# Patient Record
Sex: Male | Born: 1935 | Race: White | Hispanic: No | Marital: Married | Smoking: Never smoker
Health system: Southern US, Community
[De-identification: ages and names within clinical notes are randomized; demographics above are authoritative.]

## PROBLEM LIST (undated history)

## (undated) DIAGNOSIS — E039 Hypothyroidism, unspecified: Secondary | ICD-10-CM

## (undated) DIAGNOSIS — Z7409 Other reduced mobility: Secondary | ICD-10-CM

## (undated) DIAGNOSIS — G8929 Other chronic pain: Secondary | ICD-10-CM

## (undated) DIAGNOSIS — I10 Essential (primary) hypertension: Secondary | ICD-10-CM

## (undated) DIAGNOSIS — W19XXXA Unspecified fall, initial encounter: Secondary | ICD-10-CM

## (undated) DIAGNOSIS — M1990 Unspecified osteoarthritis, unspecified site: Secondary | ICD-10-CM

## (undated) DIAGNOSIS — E119 Type 2 diabetes mellitus without complications: Secondary | ICD-10-CM

## (undated) DIAGNOSIS — C801 Malignant (primary) neoplasm, unspecified: Secondary | ICD-10-CM

## (undated) DIAGNOSIS — K219 Gastro-esophageal reflux disease without esophagitis: Secondary | ICD-10-CM

## (undated) DIAGNOSIS — E7800 Pure hypercholesterolemia, unspecified: Secondary | ICD-10-CM

## (undated) HISTORY — PX: OTHER SURGICAL HISTORY: SHX169

## (undated) HISTORY — PX: SKIN CANCER EXCISION: SHX779

## (undated) HISTORY — PX: CATARACT EXTRACTION: SUR2

---

## 1989-03-31 HISTORY — PX: OTHER SURGICAL HISTORY: SHX169

## 1994-03-31 HISTORY — PX: CERVICAL SPINE SURGERY: SHX589

## 2017-10-23 ENCOUNTER — Encounter: Payer: Self-pay | Admitting: Student

## 2017-10-23 NOTE — H&P (Signed)
TOTAL HIP ADMISSION H&P  Patient is admitted for right total hip arthroplasty.  Subjective:  Chief Complaint: right hip pain  HPI: Nathaniel Henry, 84 y.o. male, has a history of pain and functional disability in the right hip(s) due to arthritis and patient has failed non-surgical conservative treatments for greater than 12 weeks to include NSAID's and/or analgesics, use of assistive devices and activity modification.  Onset of symptoms was gradual starting several years ago with gradually worsening course since that time.The patient noted no past surgery on the right hip(s).  Patient currently rates pain in the right hip at 10 out of 10 with activity. Patient has worsening of pain with activity and weight bearing and pain that interfers with activities of daily living. Patient has evidence of everely arthritic hips with dislocation or near dislocation of the right hip with erosion of the superior acetabulum, lateralization of the hip center, and a high riding hip center with massive bone-on-bone formation by imaging studies. This condition presents safety issues increasing the risk of falls. There is no current active infection.  There are no active problems to display for this patient.  History reviewed. No pertinent past medical history.  History reviewed. No pertinent surgical history.  No current facility-administered medications for this encounter.    Current Outpatient Medications  Medication Sig Dispense Refill Last Dose  . atorvastatin (LIPITOR) 10 MG tablet Take 10 mg by mouth every evening.     . Cholecalciferol (VITAMIN D3) 2000 units TABS Take 2,000 Units by mouth every other day. Alternates between a multivitamin and vitamin d3 supplement     . cromolyn (OPTICROM) 4 % ophthalmic solution Place 1 drop into both eyes daily as needed (for eye irritation.).     Marland Kitchen fentaNYL (DURAGESIC - DOSED MCG/HR) 75 MCG/HR Place 75 mcg onto the skin every 3 (three) days.     Marland Kitchen gabapentin  (NEURONTIN) 300 MG capsule Take 300 mg by mouth 3 (three) times daily.     Marland Kitchen glipiZIDE (GLUCOTROL) 10 MG tablet Take 10 mg by mouth daily after breakfast.     . levothyroxine (SYNTHROID, LEVOTHROID) 75 MCG tablet Take 75 mcg by mouth daily before breakfast.     . lisinopril (PRINIVIL,ZESTRIL) 5 MG tablet Take 5 mg by mouth daily.     . metFORMIN (GLUCOPHAGE) 500 MG tablet Take 500 mg by mouth daily with breakfast.     . Multiple Vitamin (MULTIVITAMIN WITH MINERALS) TABS tablet Take 1 tablet by mouth every other day. Alternates between a multivitamin and vitamin d3 supplement     . pantoprazole (PROTONIX) 40 MG tablet Take 40 mg by mouth daily before breakfast.      No Known Allergies  Social History   Tobacco Use  . Smoking status: Not on file  Substance Use Topics  . Alcohol use: Not on file    History reviewed. No pertinent family history.   Review of Systems  Constitutional: Negative for chills and fever.  HENT: Negative for congestion, sore throat and tinnitus.   Eyes: Negative for double vision, photophobia and pain.  Respiratory: Negative for cough, shortness of breath and wheezing.   Cardiovascular: Negative for chest pain, palpitations and orthopnea.  Gastrointestinal: Negative for heartburn, nausea and vomiting.  Genitourinary: Negative for dysuria, frequency and urgency.  Musculoskeletal: Positive for joint pain.  Neurological: Negative for dizziness, weakness and headaches.  Psychiatric/Behavioral: Negative for depression.    Objective:  Physical Exam  Well nourished and well developed. General: Alert and oriented  x3, cooperative and pleasant, no acute distress. Head: normocephalic, atraumatic, neck supple. Eyes: EOMI. Respiratory: breath sounds clear in all fields, no wheezing, rales, or rhonchi. Cardiovascular: Regular rate and rhythm, no murmurs, gallops or rubs.  Abdomen: non-tender to palpation and soft, normoactive bowel sounds. Musculoskeletal: Sitting in a  wheel chair. He has his right leg adducted, it is shortened and internally rotated.  Right Knee Exam: No effusion. Range of motion is 0-125 degrees. Slight crepitus on range of motion of the knee. No medial or lateral joint line tenderness. Stable knee. Right Hip Exam: ROM: Flexion to 70 , Internal Rotation minimal, External Rotation 20, and abduction 20. Moving his hip reproduces his knee pain. There is no tenderness over the greater trochanter. There is no pain on provocative testing of the hip. Left Hip Exam: ROM: Flexion to 100 , Internal Rotation minimal, External Rotation 20, and abduction 20.There is no tenderness over the greater trochanter. There is no pain on provocative testing of the hip. Left Knee Exam: No effusion. Range of motion is 0-125 degrees. No crepitus on range of motion of the knee. No medial or lateral joint line tenderness. Stable knee. Calves soft and nontender. Motor function intact in LE. Strength 5/5 LE bilaterally. Neuro: Distal pulses 2+. Sensation to light touch intact in LE.  Vital signs in last 24 hours: Blood pressure: 126/68 mmHg Pulse: 60 bpm  Labs:   There is no height or weight on file to calculate BMI.   Imaging Review Plain radiographs demonstrate severe degenerative joint disease of the right hip(s). The bone quality appears to be adequate for age and reported activity level.    Preoperative templating of the joint replacement has been completed, documented, and submitted to the Operating Room personnel in order to optimize intra-operative equipment management.     Assessment/Plan:  End stage arthritis, right hip(s)  The patient history, physical examination, clinical judgement of the provider and imaging studies are consistent with end stage degenerative joint disease of the right hip(s) and total hip arthroplasty is deemed medically necessary. The treatment options including medical management, injection therapy, arthroscopy and  arthroplasty were discussed at length. The risks and benefits of total hip arthroplasty were presented and reviewed. The risks due to aseptic loosening, infection, stiffness, dislocation/subluxation,  thromboembolic complications and other imponderables were discussed.  The patient acknowledged the explanation, agreed to proceed with the plan and consent was signed. Patient is being admitted for inpatient treatment for surgery, pain control, PT, OT, prophylactic antibiotics, VTE prophylaxis, progressive ambulation and ADL's and discharge planning.The patient is planning to be discharged to skilled nursing facility.  Therapy Plans: SNF at [facility] Disposition: SNF Planned DVT Prophylaxis: aspirin 325 mg BID DME needed: None PCP: Ardith Dark, PA-C (medical clearance provided) TXA: IV Allergies: None  - Patient was instructed on what medications to stop prior to surgery. - Follow-up visit in 2 weeks with Dr. Wynelle Link - Begin physical therapy following surgery - Pre-operative lab work as pre-surgical testing - Prescriptions will be provided in hospital at time of discharge  Theresa Duty, PA-C Orthopedic Surgery [facility] [place]

## 2017-10-28 NOTE — Patient Instructions (Addendum)
Nathaniel Henry  10/28/2017   Your procedure is scheduled on: 11-04-17     Report to [facility] Main  Entrance    Report to admitting at 12:00PM    Call this number if you have problems the morning of surgery [phone]     Remember: Do not eat food After Midnight. YOU MAY HAVE CLEAR LIQUIDS FROM MIDNIGHT UNTIL 8:30AM. NOTHING BY MOUTH AFTER 8:30AM!     Take these medicines the morning of surgery with A SIP OF WATER: GABAPENTIN, LEVOTHYROXINE, PANTOPRAZOLE, EYE DROPS                                 You may not have any metal on your body including hair pins and              piercings  Do not wear jewelry, make-up, lotions, powders or perfumes, deodorant              Men may shave face and neck.   Do not bring valuables to the hospital. [place].  Contacts, dentures or bridgework may not be worn into surgery.  Leave suitcase in the car. After surgery it may be brought to your room.                  Please read over the following fact sheets you were given: _____________________________________________________________________     CLEAR LIQUID DIET   Foods Allowed                                                                     Foods Excluded  Coffee and tea, regular and decaf                             liquids that you cannot  Plain Jell-O in any flavor                                             see through such as: Fruit ices (not with fruit pulp)                                     milk, soups, orange juice  Iced Popsicles                                    All solid food Carbonated beverages, regular and diet                                    Cranberry, grape and apple juices Sports drinks like Gatorade Lightly seasoned clear broth or consume(fat free) Sugar, honey  syrup  Sample Menu Breakfast                                Lunch                                     Supper Cranberry  juice                    Beef broth                            Chicken broth Jell-O                                     Grape juice                           Apple juice Coffee or tea                        Jell-O                                      Popsicle                                                Coffee or tea                        Coffee or tea  _____________________________________________________________________             How to Manage Your Diabetes Before and After Surgery  Why is it important to control my blood sugar before and after surgery? . Improving blood sugar levels before and after surgery helps healing and can limit problems. . A way of improving blood sugar control is eating a healthy diet by: o  Eating less sugar and carbohydrates o  Increasing activity/exercise o  Talking with your doctor about reaching your blood sugar goals . High blood sugars (greater than 180 mg/dL) can raise your risk of infections and slow your recovery, so you will need to focus on controlling your diabetes during the weeks before surgery. . Make sure that the doctor who takes care of your diabetes knows about your planned surgery including the date and location.  How do I manage my blood sugar before surgery? . Check your blood sugar at least 4 times a day, starting 2 days before surgery, to make sure that the level is not too high or low. o Check your blood sugar the morning of your surgery when you wake up and every 2 hours until you get to the Short Stay unit. . If your blood sugar is less than 70 mg/dL, you will need to treat for low blood sugar: o Do not take insulin. o Treat a low blood sugar (less than 70 mg/dL) with  cup of clear juice (cranberry or apple), 4 glucose tablets, OR glucose gel. o Recheck blood sugar in 15 minutes after  treatment (to make sure it is greater than 70 mg/dL). If your blood sugar is not greater than 70 mg/dL on recheck, call [phone] for further  instructions. . Report your blood sugar to the short stay nurse when you get to Short Stay.  . If you are admitted to the hospital after surgery: o Your blood sugar will be checked by the staff and you will probably be given insulin after surgery (instead of oral diabetes medicines) to make sure you have good blood sugar levels. o The goal for blood sugar control after surgery is 80-180 mg/dL.   WHAT DO I DO ABOUT MY DIABETES MEDICATION?       . THE MORNING OF SURGERY,Do not take oral diabetes medicines (pills)!   Patient Signature:  Date:   Nurse Signature:  Date:   Reviewed and Endorsed by [facility] Patient Education Committee, August 2015    [facility] - Preparing for Surgery Before surgery, you can play an important role.  Because skin is not sterile, your skin needs to be as free of germs as possible.  You can reduce the number of germs on your skin by washing with CHG (chlorahexidine gluconate) soap before surgery.  CHG is an antiseptic cleaner which kills germs and bonds with the skin to continue killing germs even after washing. Please DO NOT use if you have an allergy to CHG or antibacterial soaps.  If your skin becomes reddened/irritated stop using the CHG and inform your nurse when you arrive at Short Stay. Do not shave (including legs and underarms) for at least 48 hours prior to the first CHG shower.  You may shave your face/neck. Please follow these instructions carefully:  1.  Shower with CHG Soap the night before surgery and the  morning of Surgery.  2.  If you choose to wash your hair, wash your hair first as usual with your  normal  shampoo.  3.  After you shampoo, rinse your hair and body thoroughly to remove the  shampoo.                           4.  Use CHG as you would any other liquid soap.  You can apply chg directly  to the skin and wash                       Gently with a scrungie or clean washcloth.  5.  Apply the CHG Soap to your body ONLY FROM THE NECK  DOWN.   Do not use on face/ open                           Wound or open sores. Avoid contact with eyes, ears mouth and genitals (private parts).                       Wash face,  Genitals (private parts) with your normal soap.             6.  Wash thoroughly, paying special attention to the area where your surgery  will be performed.  7.  Thoroughly rinse your body with warm water from the neck down.  8.  DO NOT shower/wash with your normal soap after using and rinsing off  the CHG Soap.                9.  Fraser Din  yourself dry with a clean towel.            10.  Wear clean pajamas.            11.  Place clean sheets on your bed the night of your first shower and do not  sleep with pets. Day of Surgery : Do not apply any lotions/deodorants the morning of surgery.  Please wear clean clothes to the hospital/surgery center.  FAILURE TO FOLLOW THESE INSTRUCTIONS MAY RESULT IN THE CANCELLATION OF YOUR SURGERY PATIENT SIGNATURE_________________________________  NURSE SIGNATURE__________________________________  ________________________________________________________________________   Adam Phenix  An incentive spirometer is a tool that can help keep your lungs clear and active. This tool measures how well you are filling your lungs with each breath. Taking long deep breaths may help reverse or decrease the chance of developing breathing (pulmonary) problems (especially infection) following:  A long period of time when you are unable to move or be active. BEFORE THE PROCEDURE   If the spirometer includes an indicator to show your best effort, your nurse or respiratory therapist will set it to a desired goal.  If possible, sit up straight or lean slightly forward. Try not to slouch.  Hold the incentive spirometer in an upright position. INSTRUCTIONS FOR USE  1. Sit on the edge of your bed if possible, or sit up as far as you can in bed or on a chair. 2. Hold the incentive spirometer in  an upright position. 3. Breathe out normally. 4. Place the mouthpiece in your mouth and seal your lips tightly around it. 5. Breathe in slowly and as deeply as possible, raising the piston or the ball toward the top of the column. 6. Hold your breath for 3-5 seconds or for as long as possible. Allow the piston or ball to fall to the bottom of the column. 7. Remove the mouthpiece from your mouth and breathe out normally. 8. Rest for a few seconds and repeat Steps 1 through 7 at least 10 times every 1-2 hours when you are awake. Take your time and take a few normal breaths between deep breaths. 9. The spirometer may include an indicator to show your best effort. Use the indicator as a goal to work toward during each repetition. 10. After each set of 10 deep breaths, practice coughing to be sure your lungs are clear. If you have an incision (the cut made at the time of surgery), support your incision when coughing by placing a pillow or rolled up towels firmly against it. Once you are able to get out of bed, walk around indoors and cough well. You may stop using the incentive spirometer when instructed by your caregiver.  RISKS AND COMPLICATIONS  Take your time so you do not get dizzy or light-headed.  If you are in pain, you may need to take or ask for pain medication before doing incentive spirometry. It is harder to take a deep breath if you are having pain. AFTER USE  Rest and breathe slowly and easily.  It can be helpful to keep track of a log of your progress. Your caregiver can provide you with a simple table to help with this. If you are using the spirometer at home, follow these instructions: [place] IF:   You are having difficultly using the spirometer.  You have trouble using the spirometer as often as instructed.  Your pain medication is not giving enough relief while using the spirometer.  You develop fever of 100.5  F (38.1 C) or higher. SEEK IMMEDIATE MEDICAL CARE  IF:   You cough up bloody sputum that had not been present before.  You develop fever of 102 F (38.9 C) or greater.  You develop worsening pain at or near the incision site. MAKE SURE YOU:   Understand these instructions.  Will watch your condition.  Will get help right away if you are not doing well or get worse. Document Released: 07/28/2006 Document Revised: 06/09/2011 Document Reviewed: 09/28/2006 [employer] Patient Information 2014 [employer], [place].   ________________________________________________________________________  WHAT IS A BLOOD TRANSFUSION? Blood Transfusion Information  A transfusion is the replacement of blood or some of its parts. Blood is made up of multiple cells which provide different functions.  Red blood cells carry oxygen and are used for blood loss replacement.  White blood cells fight against infection.  Platelets control bleeding.  Plasma helps clot blood.  Other blood products are available for specialized needs, such as hemophilia or other clotting disorders. BEFORE THE TRANSFUSION  Who gives blood for transfusions?   Healthy volunteers who are fully evaluated to make sure their blood is safe. This is blood bank blood. Transfusion therapy is the safest it has ever been in the practice of medicine. Before blood is taken from a donor, a complete history is taken to make sure that person has no history of diseases nor engages in risky social behavior (examples are intravenous drug use or sexual activity with multiple partners). The donor's travel history is screened to minimize risk of transmitting infections, such as malaria. The donated blood is tested for signs of infectious diseases, such as HIV and hepatitis. The blood is then tested to be sure it is compatible with you in order to minimize the chance of a transfusion reaction. If you or a relative donates blood, this is often done in anticipation of surgery and is not appropriate for emergency  situations. It takes many days to process the donated blood. RISKS AND COMPLICATIONS Although transfusion therapy is very safe and saves many lives, the main dangers of transfusion include:   Getting an infectious disease.  Developing a transfusion reaction. This is an allergic reaction to something in the blood you were given. Every precaution is taken to prevent this. The decision to have a blood transfusion has been considered carefully by your caregiver before blood is given. Blood is not given unless the benefits outweigh the risks. AFTER THE TRANSFUSION  Right after receiving a blood transfusion, you will usually feel much better and more energetic. This is especially true if your red blood cells have gotten low (anemic). The transfusion raises the level of the red blood cells which carry oxygen, and this usually causes an energy increase.  The nurse administering the transfusion will monitor you carefully for complications. HOME CARE INSTRUCTIONS  No special instructions are needed after a transfusion. You may find your energy is better. Speak with your caregiver about any limitations on activity for underlying diseases you may have. SEEK MEDICAL CARE IF:   Your condition is not improving after your transfusion.  You develop redness or irritation at the intravenous (IV) site. SEEK IMMEDIATE MEDICAL CARE IF:  Any of the following symptoms occur over the next 12 hours:  Shaking chills.  You have a temperature by mouth above 102 F (38.9 C), not controlled by medicine.  Chest, back, or muscle pain.  People around you feel you are not acting correctly or are confused.  Shortness of breath or difficulty breathing.  Dizziness and fainting.  You get a rash or develop hives.  You have a decrease in urine output.  Your urine turns a dark color or changes to pink, red, or brown. Any of the following symptoms occur over the next 10 days:  You have a temperature by mouth above  102 F (38.9 C), not controlled by medicine.  Shortness of breath.  Weakness after normal activity.  The white part of the eye turns yellow (jaundice).  You have a decrease in the amount of urine or are urinating less often.  Your urine turns a dark color or changes to pink, red, or brown. Document Released: 03/14/2000 Document Revised: 06/09/2011 Document Reviewed: 11/01/2007 [facility] Patient Information 2014 [place], [place].  _______________________________________________________________________

## 2017-10-29 ENCOUNTER — Encounter (HOSPITAL_COMMUNITY): Payer: Self-pay

## 2017-10-29 ENCOUNTER — Other Ambulatory Visit: Payer: Self-pay

## 2017-10-29 ENCOUNTER — Encounter (HOSPITAL_COMMUNITY)
Admission: RE | Admit: 2017-10-29 | Discharge: 2017-10-29 | Disposition: A | Discharge status: Home / Self Care | Payer: Federal, State, Local not specified - PPO | Source: Ambulatory Visit | Attending: Orthopedic Surgery | Admitting: Orthopedic Surgery

## 2017-10-29 DIAGNOSIS — M1611 Unilateral primary osteoarthritis, right hip: Secondary | ICD-10-CM | POA: Diagnosis not present

## 2017-10-29 DIAGNOSIS — Z01812 Encounter for preprocedural laboratory examination: Secondary | ICD-10-CM | POA: Insufficient documentation

## 2017-10-29 HISTORY — DX: Other reduced mobility: Z74.09

## 2017-10-29 HISTORY — DX: Other chronic pain: G89.29

## 2017-10-29 HISTORY — DX: Gastro-esophageal reflux disease without esophagitis: K21.9

## 2017-10-29 HISTORY — DX: Hypothyroidism, unspecified: E03.9

## 2017-10-29 HISTORY — DX: Malignant (primary) neoplasm, unspecified: C80.1

## 2017-10-29 HISTORY — DX: Pure hypercholesterolemia, unspecified: E78.00

## 2017-10-29 HISTORY — DX: Type 2 diabetes mellitus without complications: E11.9

## 2017-10-29 HISTORY — DX: Unspecified fall, initial encounter: W19.XXXA

## 2017-10-29 LAB — COMPREHENSIVE METABOLIC PANEL
ALT: 12 U/L (ref 0–44)
AST: 15 U/L (ref 15–41)
Albumin: 4.3 g/dL (ref 3.5–5.0)
Alkaline Phosphatase: 81 U/L (ref 38–126)
Anion gap: 7 (ref 5–15)
BILIRUBIN TOTAL: 0.5 mg/dL (ref 0.3–1.2)
BUN: 35 mg/dL — ABNORMAL HIGH (ref 8–23)
CALCIUM: 9.5 mg/dL (ref 8.9–10.3)
CO2: 27 mmol/L (ref 22–32)
CREATININE: 1.01 mg/dL (ref 0.61–1.24)
Chloride: 103 mmol/L (ref 98–111)
GFR calc Af Amer: >60 mL/min (ref >60)
GFR calc non Af Amer: >60 mL/min (ref >60)
Glucose, Bld: 231 mg/dL — ABNORMAL HIGH (ref 70–99)
Potassium: 5 mmol/L (ref 3.5–5.1)
SODIUM: 137 mmol/L (ref 135–145)
TOTAL PROTEIN: 7.8 g/dL (ref 6.5–8.1)

## 2017-10-29 LAB — CBC
HCT: 40.1 % (ref 39.0–52.0)
Hemoglobin: 13.5 g/dL (ref 13.0–17.0)
MCH: 31.5 pg (ref 26.0–34.0)
MCHC: 33.7 g/dL (ref 30.0–36.0)
MCV: 93.5 fL (ref 78.0–100.0)
Platelets: 301 10*3/uL (ref 150–400)
RBC: 4.29 MIL/uL (ref 4.22–5.81)
RDW: 12.6 % (ref 11.5–15.5)
WBC: 6.9 10*3/uL (ref 4.0–10.5)

## 2017-10-29 LAB — PROTIME-INR
INR: 1.03
Prothrombin Time: 13.4 seconds (ref 11.4–15.2)

## 2017-10-29 LAB — GLUCOSE, CAPILLARY: Glucose-Capillary: 239 mg/dL — ABNORMAL HIGH (ref 70–99)

## 2017-10-29 LAB — SURGICAL PCR SCREEN
MRSA, PCR: NEGATIVE
STAPHYLOCOCCUS AUREUS: POSITIVE — AB

## 2017-10-29 LAB — HEMOGLOBIN A1C
Hgb A1c MFr Bld: 6.6 % — ABNORMAL HIGH (ref 4.8–5.6)
MEAN PLASMA GLUCOSE: 142.72 mg/dL

## 2017-10-29 LAB — APTT: APTT: 30 s (ref 24–36)

## 2017-10-30 ENCOUNTER — Encounter (HOSPITAL_COMMUNITY): Payer: Self-pay | Admitting: Emergency Medicine

## 2017-10-30 LAB — ABO/RH: ABO/RH(D): B POS

## 2017-10-30 NOTE — Progress Notes (Signed)
EKG 09-22-17 ON CHART FROM [facility] [place]  CXR 09-23-17 ON CHART FROM [facility] [place]

## 2017-10-30 NOTE — Progress Notes (Signed)
CMP ROUTED VIA Epic TO DR Wynelle Link

## 2017-11-03 NOTE — Progress Notes (Signed)
CXR 04-23-16 ON CHART FROM [facility]  BLOOD WORK 03-2016 ON CHART FROM [facility]

## 2017-11-04 ENCOUNTER — Inpatient Hospital Stay (HOSPITAL_COMMUNITY): Payer: Medicare Other

## 2017-11-04 ENCOUNTER — Inpatient Hospital Stay (HOSPITAL_COMMUNITY): Payer: Medicare Other | Admitting: Anesthesiology

## 2017-11-04 ENCOUNTER — Other Ambulatory Visit: Payer: Self-pay

## 2017-11-04 ENCOUNTER — Encounter (HOSPITAL_COMMUNITY): Payer: Self-pay | Admitting: *Deleted

## 2017-11-04 ENCOUNTER — Inpatient Hospital Stay (HOSPITAL_COMMUNITY)
Admission: RE | Admit: 2017-11-04 | Discharge: 2017-11-07 | DRG: 470 | Disposition: A | Discharge status: Skilled Nursing Facility | Payer: Medicare Other | Source: Ambulatory Visit | Attending: Orthopedic Surgery | Admitting: Orthopedic Surgery

## 2017-11-04 ENCOUNTER — Encounter (HOSPITAL_COMMUNITY)
Admission: RE | Disposition: A | Discharge status: Skilled Nursing Facility | Payer: Self-pay | Source: Ambulatory Visit | Attending: Orthopedic Surgery

## 2017-11-04 ENCOUNTER — Telehealth (HOSPITAL_COMMUNITY): Payer: Self-pay | Admitting: *Deleted

## 2017-11-04 DIAGNOSIS — M1611 Unilateral primary osteoarthritis, right hip: Secondary | ICD-10-CM | POA: Diagnosis present

## 2017-11-04 DIAGNOSIS — G8929 Other chronic pain: Secondary | ICD-10-CM | POA: Diagnosis present

## 2017-11-04 DIAGNOSIS — Z79890 Hormone replacement therapy (postmenopausal): Secondary | ICD-10-CM | POA: Diagnosis not present

## 2017-11-04 DIAGNOSIS — E7800 Pure hypercholesterolemia, unspecified: Secondary | ICD-10-CM | POA: Diagnosis present

## 2017-11-04 DIAGNOSIS — Z96649 Presence of unspecified artificial hip joint: Secondary | ICD-10-CM

## 2017-11-04 DIAGNOSIS — D62 Acute posthemorrhagic anemia: Secondary | ICD-10-CM | POA: Diagnosis not present

## 2017-11-04 DIAGNOSIS — M169 Osteoarthritis of hip, unspecified: Secondary | ICD-10-CM

## 2017-11-04 DIAGNOSIS — Z85828 Personal history of other malignant neoplasm of skin: Secondary | ICD-10-CM | POA: Diagnosis not present

## 2017-11-04 DIAGNOSIS — Z7984 Long term (current) use of oral hypoglycemic drugs: Secondary | ICD-10-CM

## 2017-11-04 DIAGNOSIS — M25551 Pain in right hip: Secondary | ICD-10-CM | POA: Diagnosis present

## 2017-11-04 DIAGNOSIS — K219 Gastro-esophageal reflux disease without esophagitis: Secondary | ICD-10-CM | POA: Diagnosis present

## 2017-11-04 DIAGNOSIS — I10 Essential (primary) hypertension: Secondary | ICD-10-CM | POA: Diagnosis present

## 2017-11-04 DIAGNOSIS — E119 Type 2 diabetes mellitus without complications: Secondary | ICD-10-CM | POA: Diagnosis present

## 2017-11-04 DIAGNOSIS — E039 Hypothyroidism, unspecified: Secondary | ICD-10-CM | POA: Diagnosis present

## 2017-11-04 HISTORY — DX: Unspecified osteoarthritis, unspecified site: M19.90

## 2017-11-04 HISTORY — DX: Essential (primary) hypertension: I10

## 2017-11-04 HISTORY — PX: TOTAL HIP ARTHROPLASTY: SHX124

## 2017-11-04 LAB — TYPE AND SCREEN
ABO/RH(D): B POS
Antibody Screen: NEGATIVE

## 2017-11-04 LAB — GLUCOSE, CAPILLARY
Glucose-Capillary: 98 mg/dL (ref 70–99)
Glucose-Capillary: 123 mg/dL — ABNORMAL HIGH (ref 70–99)
Glucose-Capillary: 339 mg/dL — ABNORMAL HIGH (ref 70–99)

## 2017-11-04 SURGERY — ARTHROPLASTY, HIP, TOTAL, ANTERIOR APPROACH
Anesthesia: Spinal | Site: Hip | Laterality: Right

## 2017-11-04 MED ORDER — METHOCARBAMOL 500 MG IVPB - SIMPLE MED
INTRAVENOUS | Status: AC
  Administered 2017-11-04: 500 mg via INTRAVENOUS
  Filled 2017-11-04: qty 50

## 2017-11-04 MED ORDER — LEVOTHYROXINE SODIUM 75 MCG PO TABS
75.0000 ug | ORAL_TABLET | Freq: Every day | ORAL | Status: DC
  Administered 2017-11-05 – 2017-11-07 (×3): 75 ug via ORAL
  Filled 2017-11-04 (×3): qty 1

## 2017-11-04 MED ORDER — EPHEDRINE 5 MG/ML INJ
INTRAVENOUS | Status: AC
  Filled 2017-11-04: qty 10

## 2017-11-04 MED ORDER — ONDANSETRON HCL 4 MG/2ML IJ SOLN
4.0000 mg | Freq: Four times a day (QID) | INTRAMUSCULAR | Status: DC | PRN
  Administered 2017-11-06: 4 mg via INTRAVENOUS
  Filled 2017-11-04: qty 2

## 2017-11-04 MED ORDER — ALBUMIN HUMAN 5 % IV SOLN
INTRAVENOUS | Status: DC | PRN
  Administered 2017-11-04: 15:00:00 via INTRAVENOUS

## 2017-11-04 MED ORDER — BISACODYL 10 MG RE SUPP: 10.0000 mg | Freq: Every day | RECTAL | Status: DC | PRN

## 2017-11-04 MED ORDER — SODIUM CHLORIDE 0.9 % IV SOLN
INTRAVENOUS | Status: DC
  Administered 2017-11-04 – 2017-11-05 (×2): via INTRAVENOUS

## 2017-11-04 MED ORDER — METOCLOPRAMIDE HCL 5 MG PO TABS: 5.0000 mg | ORAL_TABLET | Freq: Three times a day (TID) | ORAL | Status: DC | PRN

## 2017-11-04 MED ORDER — BUPIVACAINE-EPINEPHRINE (PF) 0.25% -1:200000 IJ SOLN
INTRAMUSCULAR | Status: AC
  Filled 2017-11-04: qty 30

## 2017-11-04 MED ORDER — ONDANSETRON HCL 4 MG PO TABS: 4.0000 mg | ORAL_TABLET | Freq: Four times a day (QID) | ORAL | Status: DC | PRN

## 2017-11-04 MED ORDER — PROPOFOL 500 MG/50ML IV EMUL
INTRAVENOUS | Status: DC | PRN
  Administered 2017-11-04: 25 ug/kg/min via INTRAVENOUS

## 2017-11-04 MED ORDER — CEFAZOLIN SODIUM-DEXTROSE 2-4 GM/100ML-% IV SOLN
2.0000 g | Freq: Four times a day (QID) | INTRAVENOUS | Status: AC
  Administered 2017-11-04 – 2017-11-05 (×2): 2 g via INTRAVENOUS
  Filled 2017-11-04 (×2): qty 100

## 2017-11-04 MED ORDER — CROMOLYN SODIUM 4 % OP SOLN
1.0000 [drp] | Freq: Every day | OPHTHALMIC | Status: DC | PRN
  Filled 2017-11-04: qty 10

## 2017-11-04 MED ORDER — DEXAMETHASONE SODIUM PHOSPHATE 10 MG/ML IJ SOLN
8.0000 mg | Freq: Once | INTRAMUSCULAR | Status: AC
  Administered 2017-11-04: 10 mg via INTRAVENOUS

## 2017-11-04 MED ORDER — STERILE WATER FOR IRRIGATION IR SOLN
Status: DC | PRN
  Administered 2017-11-04: 2000 mL

## 2017-11-04 MED ORDER — HYDROCODONE-ACETAMINOPHEN 7.5-325 MG PO TABS
1.0000 | ORAL_TABLET | ORAL | Status: DC | PRN
  Administered 2017-11-04 – 2017-11-06 (×6): 1 via ORAL
  Filled 2017-11-04 (×6): qty 1

## 2017-11-04 MED ORDER — BUPIVACAINE IN DEXTROSE 0.75-8.25 % IT SOLN
INTRATHECAL | Status: DC | PRN
  Administered 2017-11-04: 1.6 mL via INTRATHECAL

## 2017-11-04 MED ORDER — MORPHINE SULFATE (PF) 2 MG/ML IV SOLN: 0.5000 mg | INTRAVENOUS | Status: DC | PRN

## 2017-11-04 MED ORDER — ALBUMIN HUMAN 5 % IV SOLN
INTRAVENOUS | Status: AC
  Filled 2017-11-04: qty 250

## 2017-11-04 MED ORDER — CHLORHEXIDINE GLUCONATE 4 % EX LIQD: 60.0000 mL | Freq: Once | CUTANEOUS | Status: DC

## 2017-11-04 MED ORDER — HYDROMORPHONE HCL 1 MG/ML IJ SOLN
0.2500 mg | INTRAMUSCULAR | Status: DC | PRN
  Administered 2017-11-04: 0.5 mg via INTRAVENOUS
  Administered 2017-11-04: 0.25 mg via INTRAVENOUS

## 2017-11-04 MED ORDER — PROPOFOL 10 MG/ML IV BOLUS
INTRAVENOUS | Status: DC | PRN
  Administered 2017-11-04: 10 mg via INTRAVENOUS

## 2017-11-04 MED ORDER — ALBUMIN HUMAN 5 % IV SOLN
INTRAVENOUS | Status: DC | PRN
  Administered 2017-11-04: 16:00:00 via INTRAVENOUS

## 2017-11-04 MED ORDER — METHOCARBAMOL 500 MG PO TABS
500.0000 mg | ORAL_TABLET | Freq: Four times a day (QID) | ORAL | Status: DC | PRN
  Administered 2017-11-05 (×3): 500 mg via ORAL
  Filled 2017-11-04 (×3): qty 1

## 2017-11-04 MED ORDER — DEXAMETHASONE SODIUM PHOSPHATE 10 MG/ML IJ SOLN
10.0000 mg | Freq: Once | INTRAMUSCULAR | Status: AC
  Administered 2017-11-05: 10 mg via INTRAVENOUS
  Filled 2017-11-04: qty 1

## 2017-11-04 MED ORDER — ONDANSETRON HCL 4 MG/2ML IJ SOLN
INTRAMUSCULAR | Status: AC
  Filled 2017-11-04: qty 2

## 2017-11-04 MED ORDER — POLYETHYLENE GLYCOL 3350 17 G PO PACK
17.0000 g | PACK | Freq: Every day | ORAL | Status: DC | PRN
  Filled 2017-11-04: qty 1

## 2017-11-04 MED ORDER — EPHEDRINE SULFATE 50 MG/ML IJ SOLN
INTRAMUSCULAR | Status: DC | PRN
  Administered 2017-11-04: 10 mg via INTRAVENOUS

## 2017-11-04 MED ORDER — ATORVASTATIN CALCIUM 10 MG PO TABS
10.0000 mg | ORAL_TABLET | Freq: Every evening | ORAL | Status: DC
  Administered 2017-11-04 – 2017-11-06 (×3): 10 mg via ORAL
  Filled 2017-11-04 (×3): qty 1

## 2017-11-04 MED ORDER — FLEET ENEMA 7-19 GM/118ML RE ENEM: 1.0000 | ENEMA | Freq: Once | RECTAL | Status: DC | PRN

## 2017-11-04 MED ORDER — FENTANYL 75 MCG/HR TD PT72
75.0000 ug | MEDICATED_PATCH | TRANSDERMAL | Status: DC
  Administered 2017-11-05: 75 ug via TRANSDERMAL
  Filled 2017-11-04: qty 1

## 2017-11-04 MED ORDER — GABAPENTIN 300 MG PO CAPS
300.0000 mg | ORAL_CAPSULE | Freq: Three times a day (TID) | ORAL | Status: DC
  Administered 2017-11-04 – 2017-11-07 (×8): 300 mg via ORAL
  Filled 2017-11-04 (×8): qty 1

## 2017-11-04 MED ORDER — ACETAMINOPHEN 325 MG PO TABS
325.0000 mg | ORAL_TABLET | Freq: Four times a day (QID) | ORAL | Status: DC | PRN
  Administered 2017-11-05: 325 mg via ORAL
  Administered 2017-11-06 – 2017-11-07 (×2): 650 mg via ORAL
  Filled 2017-11-04 (×2): qty 2
  Filled 2017-11-04: qty 1

## 2017-11-04 MED ORDER — METOCLOPRAMIDE HCL 5 MG/ML IJ SOLN: 5.0000 mg | Freq: Three times a day (TID) | INTRAMUSCULAR | Status: DC | PRN

## 2017-11-04 MED ORDER — GLYCOPYRROLATE PF 0.2 MG/ML IJ SOSY
PREFILLED_SYRINGE | INTRAMUSCULAR | Status: AC
  Filled 2017-11-04: qty 2

## 2017-11-04 MED ORDER — GLYCOPYRROLATE 0.2 MG/ML IJ SOLN
INTRAMUSCULAR | Status: DC | PRN
  Administered 2017-11-04: 0.4 mg via INTRAVENOUS

## 2017-11-04 MED ORDER — BUPIVACAINE-EPINEPHRINE (PF) 0.25% -1:200000 IJ SOLN
INTRAMUSCULAR | Status: DC | PRN
  Administered 2017-11-04: 30 mL

## 2017-11-04 MED ORDER — ACETAMINOPHEN 10 MG/ML IV SOLN
1000.0000 mg | Freq: Four times a day (QID) | INTRAVENOUS | Status: DC
  Administered 2017-11-04: 1000 mg via INTRAVENOUS
  Filled 2017-11-04: qty 100

## 2017-11-04 MED ORDER — LACTATED RINGERS IV SOLN
INTRAVENOUS | Status: DC
  Administered 2017-11-04 (×2): via INTRAVENOUS

## 2017-11-04 MED ORDER — MENTHOL 3 MG MT LOZG
1.0000 | LOZENGE | OROMUCOSAL | Status: DC | PRN
  Administered 2017-11-04 – 2017-11-06 (×2): 3 mg via ORAL
  Filled 2017-11-04 (×3): qty 9

## 2017-11-04 MED ORDER — PROPOFOL 10 MG/ML IV BOLUS
INTRAVENOUS | Status: AC
  Filled 2017-11-04: qty 20

## 2017-11-04 MED ORDER — ONDANSETRON HCL 4 MG/2ML IJ SOLN
INTRAMUSCULAR | Status: DC | PRN
  Administered 2017-11-04: 4 mg via INTRAVENOUS

## 2017-11-04 MED ORDER — SODIUM CHLORIDE 0.9 % IV SOLN
1000.0000 mg | INTRAVENOUS | Status: AC
  Administered 2017-11-04: 1000 mg via INTRAVENOUS
  Filled 2017-11-04: qty 10

## 2017-11-04 MED ORDER — TRANEXAMIC ACID 1000 MG/10ML IV SOLN
1000.0000 mg | Freq: Once | INTRAVENOUS | Status: AC
  Administered 2017-11-04: 1000 mg via INTRAVENOUS
  Filled 2017-11-04: qty 1000

## 2017-11-04 MED ORDER — DEXAMETHASONE SODIUM PHOSPHATE 10 MG/ML IJ SOLN
INTRAMUSCULAR | Status: AC
  Filled 2017-11-04: qty 1

## 2017-11-04 MED ORDER — PHENOL 1.4 % MT LIQD
1.0000 | OROMUCOSAL | Status: DC | PRN
  Filled 2017-11-04: qty 177

## 2017-11-04 MED ORDER — RIVAROXABAN 10 MG PO TABS
10.0000 mg | ORAL_TABLET | Freq: Every day | ORAL | Status: DC
  Administered 2017-11-05 – 2017-11-07 (×3): 10 mg via ORAL
  Filled 2017-11-04 (×3): qty 1

## 2017-11-04 MED ORDER — PHENYLEPHRINE 40 MCG/ML (10ML) SYRINGE FOR IV PUSH (FOR BLOOD PRESSURE SUPPORT)
PREFILLED_SYRINGE | INTRAVENOUS | Status: DC | PRN
  Administered 2017-11-04 (×7): 40 ug via INTRAVENOUS

## 2017-11-04 MED ORDER — DIPHENHYDRAMINE HCL 12.5 MG/5ML PO ELIX: 12.5000 mg | ORAL_SOLUTION | ORAL | Status: DC | PRN

## 2017-11-04 MED ORDER — HYDROCODONE-ACETAMINOPHEN 5-325 MG PO TABS
1.0000 | ORAL_TABLET | ORAL | Status: DC | PRN
  Administered 2017-11-07: 2 via ORAL
  Filled 2017-11-04: qty 2

## 2017-11-04 MED ORDER — CEFAZOLIN SODIUM-DEXTROSE 2-4 GM/100ML-% IV SOLN
2.0000 g | INTRAVENOUS | Status: AC
  Administered 2017-11-04: 2 g via INTRAVENOUS
  Filled 2017-11-04: qty 100

## 2017-11-04 MED ORDER — PROMETHAZINE HCL 25 MG/ML IJ SOLN: 6.2500 mg | INTRAMUSCULAR | Status: DC | PRN

## 2017-11-04 MED ORDER — DOCUSATE SODIUM 100 MG PO CAPS
100.0000 mg | ORAL_CAPSULE | Freq: Two times a day (BID) | ORAL | Status: DC
  Administered 2017-11-04 – 2017-11-06 (×5): 100 mg via ORAL
  Filled 2017-11-04 (×6): qty 1

## 2017-11-04 MED ORDER — GLIPIZIDE 10 MG PO TABS
10.0000 mg | ORAL_TABLET | Freq: Every day | ORAL | Status: DC
  Administered 2017-11-05 – 2017-11-07 (×3): 10 mg via ORAL
  Filled 2017-11-04 (×3): qty 1

## 2017-11-04 MED ORDER — METHOCARBAMOL 500 MG IVPB - SIMPLE MED
500.0000 mg | Freq: Four times a day (QID) | INTRAVENOUS | Status: DC | PRN
  Administered 2017-11-04: 500 mg via INTRAVENOUS
  Filled 2017-11-04: qty 50

## 2017-11-04 MED ORDER — PANTOPRAZOLE SODIUM 40 MG PO TBEC
40.0000 mg | DELAYED_RELEASE_TABLET | Freq: Every day | ORAL | Status: DC
  Administered 2017-11-05 – 2017-11-07 (×3): 40 mg via ORAL
  Filled 2017-11-04 (×3): qty 1

## 2017-11-04 MED ORDER — PHENYLEPHRINE 40 MCG/ML (10ML) SYRINGE FOR IV PUSH (FOR BLOOD PRESSURE SUPPORT)
PREFILLED_SYRINGE | INTRAVENOUS | Status: AC
  Filled 2017-11-04: qty 10

## 2017-11-04 MED ORDER — HYDROMORPHONE HCL 1 MG/ML IJ SOLN
INTRAMUSCULAR | Status: AC
  Administered 2017-11-04: 0.25 mg via INTRAVENOUS
  Filled 2017-11-04: qty 1

## 2017-11-04 MED ORDER — 0.9 % SODIUM CHLORIDE (POUR BTL) OPTIME
TOPICAL | Status: DC | PRN
  Administered 2017-11-04: 1000 mL

## 2017-11-04 MED ORDER — LACTATED RINGERS IV SOLN: INTRAVENOUS | Status: DC

## 2017-11-04 MED ORDER — INSULIN ASPART 100 UNIT/ML ~~LOC~~ SOLN
0.0000 [IU] | Freq: Three times a day (TID) | SUBCUTANEOUS | Status: DC
  Administered 2017-11-05: 2 [IU] via SUBCUTANEOUS
  Administered 2017-11-05 (×2): 5 [IU] via SUBCUTANEOUS
  Administered 2017-11-06 (×2): 3 [IU] via SUBCUTANEOUS

## 2017-11-04 SURGICAL SUPPLY — 46 items
BAG DECANTER FOR FLEXI CONT (MISCELLANEOUS) ×3 IMPLANT
BAG ZIPLOCK 12X15 (MISCELLANEOUS) IMPLANT
BLADE SAG 18X100X1.27 (BLADE) ×3 IMPLANT
CLOSURE WOUND 1/2 X4 (GAUZE/BANDAGES/DRESSINGS) ×2
COVER PERINEAL POST (MISCELLANEOUS) ×3 IMPLANT
COVER SURGICAL LIGHT HANDLE (MISCELLANEOUS) ×3 IMPLANT
CUP ACETBLR 54 OD PINNACLE (Hips) ×3 IMPLANT
DECANTER SPIKE VIAL GLASS SM (MISCELLANEOUS) ×3 IMPLANT
DRAPE STERI IOBAN 125X83 (DRAPES) ×3 IMPLANT
DRAPE U-SHAPE 47X51 STRL (DRAPES) ×6 IMPLANT
DRSG ADAPTIC 3X8 NADH LF (GAUZE/BANDAGES/DRESSINGS) ×3 IMPLANT
DRSG MEPILEX BORDER 4X4 (GAUZE/BANDAGES/DRESSINGS) ×3 IMPLANT
DRSG MEPILEX BORDER 4X8 (GAUZE/BANDAGES/DRESSINGS) ×3 IMPLANT
DURAPREP 26ML APPLICATOR (WOUND CARE) ×3 IMPLANT
ELECT REM PT RETURN 15FT ADLT (MISCELLANEOUS) ×3 IMPLANT
EVACUATOR 1/8 PVC DRAIN (DRAIN) ×3 IMPLANT
GLOVE BIO SURGEON STRL SZ7 (GLOVE) ×3 IMPLANT
GLOVE BIO SURGEON STRL SZ8 (GLOVE) ×3 IMPLANT
GLOVE BIOGEL PI IND STRL 6.5 (GLOVE) ×2 IMPLANT
GLOVE BIOGEL PI IND STRL 7.0 (GLOVE) ×1 IMPLANT
GLOVE BIOGEL PI IND STRL 8 (GLOVE) ×1 IMPLANT
GLOVE BIOGEL PI INDICATOR 6.5 (GLOVE) ×4
GLOVE BIOGEL PI INDICATOR 7.0 (GLOVE) ×2
GLOVE BIOGEL PI INDICATOR 8 (GLOVE) ×2
GLOVE SURG SS PI 6.5 STRL IVOR (GLOVE) ×6 IMPLANT
GOWN STRL REUS W/TWL LRG LVL3 (GOWN DISPOSABLE) ×3 IMPLANT
GOWN STRL REUS W/TWL XL LVL3 (GOWN DISPOSABLE) ×3 IMPLANT
HEAD M SROM 36MM PLUS 1.5 (Hips) ×1 IMPLANT
HOLDER FOLEY CATH W/STRAP (MISCELLANEOUS) ×3 IMPLANT
LINER MARATHON NEUT +4X54X36 (Hips) ×3 IMPLANT
MANIFOLD NEPTUNE II (INSTRUMENTS) ×3 IMPLANT
PACK ANTERIOR HIP CUSTOM (KITS) ×3 IMPLANT
SCREW 6.5MMX30MM (Screw) ×6 IMPLANT
SCREW PINN CAN 6.5X20 (Screw) ×3 IMPLANT
SROM M HEAD 36MM PLUS 1.5 (Hips) ×3 IMPLANT
STEM FEM ACTIS HIGH SZ7 (Stem) ×3 IMPLANT
STRIP CLOSURE SKIN 1/2X4 (GAUZE/BANDAGES/DRESSINGS) ×4 IMPLANT
SUT ETHIBOND NAB CT1 #1 30IN (SUTURE) ×3 IMPLANT
SUT MNCRL AB 4-0 PS2 18 (SUTURE) ×3 IMPLANT
SUT STRATAFIX 0 PDS 27 VIOLET (SUTURE) ×3
SUT VIC AB 2-0 CT1 27 (SUTURE) ×4
SUT VIC AB 2-0 CT1 TAPERPNT 27 (SUTURE) ×2 IMPLANT
SUTURE STRATFX 0 PDS 27 VIOLET (SUTURE) ×1 IMPLANT
SYR 50ML LL SCALE MARK (SYRINGE) IMPLANT
TRAY FOLEY MTR SLVR 16FR STAT (SET/KITS/TRAYS/PACK) ×3 IMPLANT
YANKAUER SUCT BULB TIP 10FT TU (MISCELLANEOUS) ×3 IMPLANT

## 2017-11-04 NOTE — Discharge Instructions (Addendum)
°Dr. Frank Aluisio °Total Joint Specialist °[facility] °[address] °[place], [place] [address] °[phone] ° °ANTERIOR APPROACH TOTAL HIP REPLACEMENT POSTOPERATIVE DIRECTIONS ° ° °Hip Rehabilitation, Guidelines Following Surgery  °The results of a hip operation are greatly improved after range of motion and muscle strengthening exercises. Follow all safety measures which are given to protect your hip. If any of these exercises cause increased pain or swelling in your joint, decrease the amount until you are comfortable again. Then slowly increase the exercises. Call your caregiver if you have problems or questions.  ° °HOME CARE INSTRUCTIONS  °• Remove items at home which could result in a fall. This includes throw rugs or furniture in walking pathways.  °· ICE to the affected hip every three hours for 30 minutes at a time and then as needed for pain and swelling.  Continue to use ice on the hip for pain and swelling from surgery. You may notice swelling that will progress down to the foot and ankle.  This is normal after surgery.  Elevate the leg when you are not up walking on it.   °· Continue to use the breathing machine which will help keep your temperature down.  It is common for your temperature to cycle up and down following surgery, especially at night when you are not up moving around and exerting yourself.  The breathing machine keeps your lungs expanded and your temperature down. ° °DIET °You may resume your previous home diet once your are discharged from the hospital. ° °DRESSING / WOUND CARE / SHOWERING °You may shower 3 days after surgery, but keep the wounds dry during showering.  You may use an occlusive plastic wrap (Press'n Seal for example), NO SOAKING/SUBMERGING IN THE BATHTUB.  If the bandage gets wet, change with a clean dry gauze.  If the incision gets wet, pat the wound dry with a clean towel. °You may start showering once you are discharged home but do not submerge the  incision under water. Just pat the incision dry and apply a dry gauze dressing on daily. °Change the surgical dressing daily and reapply a dry dressing each time. ° °ACTIVITY °Walk with your walker as instructed. °Use walker as long as suggested by your caregivers. °Avoid periods of inactivity such as sitting longer than an hour when not asleep. This helps prevent blood clots.  °You may resume a sexual relationship in one month or when given the OK by your doctor.  °You may return to work once you are cleared by your doctor.  °Do not drive a car for 6 weeks or until released by you surgeon.  °Do not drive while taking narcotics. ° °WEIGHT BEARING °Weight bearing as tolerated with assist device (walker, cane, etc) as directed, use it as long as suggested by your surgeon or therapist, typically at least 4-6 weeks. ° °POSTOPERATIVE CONSTIPATION PROTOCOL °Constipation - defined medically as fewer than three stools per week and severe constipation as less than one stool per week. ° °One of the most common issues patients have following surgery is constipation.  Even if you have a regular bowel pattern at home, your normal regimen is likely to be disrupted due to multiple reasons following surgery.  Combination of anesthesia, postoperative narcotics, change in appetite and fluid intake all can affect your bowels.  In order to avoid complications following surgery, here are some recommendations in order to help you during your recovery period. ° °Colace (docusate) - Pick up an over-the-counter form   of Colace or another stool softener and take twice a day as long as you are requiring postoperative pain medications.  Take with a full glass of water daily.  If you experience loose stools or diarrhea, hold the colace until you stool forms back up.  If your symptoms do not get better within 1 week or if they get worse, check with your doctor. ° °Dulcolax (bisacodyl) - Pick up over-the-counter and take as directed by the product  packaging as needed to assist with the movement of your bowels.  Take with a full glass of water.  Use this product as needed if not relieved by Colace only.  ° °MiraLax (polyethylene glycol) - Pick up over-the-counter to have on hand.  MiraLax is a solution that will increase the amount of water in your bowels to assist with bowel movements.  Take as directed and can mix with a glass of water, juice, soda, coffee, or tea.  Take if you go more than two days without a movement. °Do not use MiraLax more than once per day. Call your doctor if you are still constipated or irregular after using this medication for 7 days in a row. ° °If you continue to have problems with postoperative constipation, please contact the office for further assistance and recommendations.  If you experience "the worst abdominal pain ever" or develop nausea or vomiting, please contact the office immediatly for further recommendations for treatment. ° °ITCHING ° If you experience itching with your medications, try taking only a single pain pill, or even half a pain pill at a time.  You can also use Benadryl over the counter for itching or also to help with sleep.  ° °TED HOSE STOCKINGS °Wear the elastic stockings on both legs for three weeks following surgery during the day but you may remove then at night for sleeping. ° °MEDICATIONS °See your medication summary on the “After Visit Summary” that the nursing staff will review with you prior to discharge.  You may have some home medications which will be placed on hold until you complete the course of blood thinner medication.  It is important for you to complete the blood thinner medication as prescribed by your surgeon.  Continue your approved medications as instructed at time of discharge. ° °PRECAUTIONS °If you experience chest pain or shortness of breath - call 911 immediately for transfer to the hospital emergency department.  °If you develop a fever greater that 101 F, purulent drainage  from wound, increased redness or drainage from wound, foul odor from the wound/dressing, or calf pain - CONTACT YOUR SURGEON.   °                                                °FOLLOW-UP APPOINTMENTS °Make sure you keep all of your appointments after your operation with your surgeon and caregivers. You should call the office at the above phone number and make an appointment for approximately two weeks after the date of your surgery or on the date instructed by your surgeon outlined in the "After Visit Summary". ° °RANGE OF MOTION AND STRENGTHENING EXERCISES  °These exercises are designed to help you keep full movement of your hip joint. Follow your caregiver's or physical therapist's instructions. Perform all exercises about fifteen times, three times per day or as directed. Exercise both hips, even if you have   had only one joint replacement. These exercises can be done on a training (exercise) mat, on the floor, on a table or on a bed. Use whatever works the best and is most comfortable for you. Use music or television while you are exercising so that the exercises are a pleasant break in your day. This will make your life better with the exercises acting as a break in routine you can look forward to.  °• Lying on your back, slowly slide your foot toward your buttocks, raising your knee up off the floor. Then slowly slide your foot back down until your leg is straight again.  °• Lying on your back spread your legs as far apart as you can without causing discomfort.  °• Lying on your side, raise your upper leg and foot straight up from the floor as far as is comfortable. Slowly lower the leg and repeat.  °• Lying on your back, tighten up the muscle in the front of your thigh (quadriceps muscles). You can do this by keeping your leg straight and trying to raise your heel off the floor. This helps strengthen the largest muscle supporting your knee.  °• Lying on your back, tighten up the muscles of your buttocks both  with the legs straight and with the knee bent at a comfortable angle while keeping your heel on the floor.  ° °IF YOU ARE TRANSFERRED TO A SKILLED REHAB FACILITY °If the patient is transferred to a skilled rehab facility following release from the hospital, a list of the current medications will be sent to the facility for the patient to continue.  When discharged from the skilled rehab facility, please have the facility set up the patient's Home Health Physical Therapy prior to being released. Also, the skilled facility will be responsible for providing the patient with their medications at time of release from the facility to include their pain medication, the muscle relaxants, and their blood thinner medication. If the patient is still at the rehab facility at time of the two week follow up appointment, the skilled rehab facility will also need to assist the patient in arranging follow up appointment in our office and any transportation needs. ° °MAKE SURE YOU:  °• Understand these instructions.  °• Get help right away if you are not doing well or get worse.  ° ° °Pick up stool softner and laxative for home use following surgery while on pain medications. °Do not submerge incision under water. °Please use good hand washing techniques while changing dressing each day. °May shower starting three days after surgery. °Please use a clean towel to pat the incision dry following showers. °Continue to use ice for pain and swelling after surgery. °Do not use any lotions or creams on the incision until instructed by your surgeon. ° °Information on my medicine - XARELTO® (Rivaroxaban) ° °Why was Xarelto® prescribed for you? °Xarelto® was prescribed for you to reduce the risk of blood clots forming after orthopedic surgery. The medical term for these abnormal blood clots is venous thromboembolism (VTE). ° °What do you need to know about xarelto® ? °Take your Xarelto® ONCE DAILY at the same time every day. °You may take it  either with or without food. ° °If you have difficulty swallowing the tablet whole, you may crush it and mix in applesauce just prior to taking your dose. ° °Take Xarelto® exactly as prescribed by your doctor and DO NOT stop taking Xarelto® without talking to the doctor who prescribed the medication.    Stopping without other VTE prevention medication to take the place of Xarelto® may increase your risk of developing a clot. ° °After discharge, you should have regular check-up appointments with your healthcare provider that is prescribing your Xarelto®.   ° °What do you do if you miss a dose? °If you miss a dose, take it as soon as you remember on the same day then continue your regularly scheduled once daily regimen the next day. Do not take two doses of Xarelto® on the same day.  ° °Important Safety Information °A possible side effect of Xarelto® is bleeding. You should call your healthcare provider right away if you experience any of the following: °? Bleeding from an injury or your nose that does not stop. °? Unusual colored urine (red or dark brown) or unusual colored stools (red or black). °? Unusual bruising for unknown reasons. °? A serious fall or if you hit your head (even if there is no bleeding). ° °Some medicines may interact with Xarelto® and might increase your risk of bleeding while on Xarelto®. To help avoid this, consult your healthcare provider or pharmacist prior to using any new prescription or non-prescription medications, including herbals, vitamins, non-steroidal anti-inflammatory drugs (NSAIDs) and supplements. ° °This website has more information on Xarelto®: www.xarelto.com. ° ° ° °

## 2017-11-04 NOTE — Op Note (Signed)
OPERATIVE REPORT- TOTAL HIP ARTHROPLASTY   PREOPERATIVE DIAGNOSIS: Osteoarthritis of the Right hip.   POSTOPERATIVE DIAGNOSIS: Osteoarthritis of the Right  hip.   PROCEDURE: Right total hip arthroplasty, anterior approach.   SURGEON: Gaynelle Arabian, MD   ASSISTANT: Theresa Duty, PA-C  ANESTHESIA:  Spinal  ESTIMATED BLOOD LOSS:-800 mL    DRAINS: Hemovac x1.   COMPLICATIONS: None   CONDITION: PACU - hemodynamically stable.   BRIEF CLINICAL NOTE: Nathaniel Henry is a 84 y.o. male who has advanced end-  stage arthritis of their Right  hip with progressively worsening pain and  dysfunction.The patient has failed nonoperative management and presents for  total hip arthroplasty.   PROCEDURE IN DETAIL: After successful administration of spinal  anesthetic, the traction boots for the [facility] bed were placed on both  feet and the patient was placed onto the [facility] bed, boots placed into the leg  holders. The Right hip was then isolated from the perineum with plastic  drapes and prepped and draped in the usual sterile fashion. ASIS and  greater trochanter were marked and a oblique incision was made, starting  at about 1 cm lateral and 2 cm distal to the ASIS and coursing towards  the anterior cortex of the femur. The skin was cut with a 10 blade  through subcutaneous tissue to the level of the fascia overlying the  tensor fascia lata muscle. The fascia was then incised in line with the  incision at the junction of the anterior third and posterior 2/3rd. The  muscle was teased off the fascia and then the interval between the TFL  and the rectus was developed. The Hohmann retractor was then placed at  the top of the femoral neck over the capsule. The vessels overlying the  capsule were cauterized and the fat on top of the capsule was removed.  A Hohmann retractor was then placed anterior underneath the rectus  femoris to give exposure to the entire anterior capsule. A T-shaped   capsulotomy was performed. The edges were tagged and the femoral head  was identified.       Osteophytes are removed off the superior acetabulum.  The femoral neck was then cut in situ with an oscillating saw. Traction  was then applied to the left lower extremity utilizing the [facility]  traction. The femoral head was then removed. Retractors were placed  around the acetabulum and then circumferential removal of the labrum was  performed. 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 Osteophytes were also removed. Reaming starts at 49 mm to  medialize and  Increased in 2 mm increments to 53 mm. We reamed in  approximately 40 degrees of abduction, 20 degrees anteversion. The hip center is above the fovea secondary to the acetabular defect. A 54 mm  pinnacle acetabular shell was then impacted in anatomic position under  fluoroscopic guidance with excellent purchase. We  Placed 3 additional dome screws to enhance the fixation. A 36 mm neutral + 4 marathon liner was then  placed into the acetabular shell.       The femoral lift was then placed along the lateral aspect of the femur  just distal to the vastus ridge. The leg was  externally rotated and capsule  was stripped off the inferior aspect of the femoral neck down to the  level of the lesser trochanter, this was done with electrocautery. The femur was lifted after this was performed. The  leg was  then placed in an extended and adducted position essentially delivering the femur. We also removed the capsule superiorly and the piriformis from the piriformis fossa to gain excellent exposure of the  proximal femur. Rongeur was used to remove some cancellous bone to get  into the lateral portion of the proximal femur for placement of the  initial starter reamer. The starter broaches was placed  the starter broach  and was shown to go down the center of the canal. Broaching  with the Actis system was then performed  starting at size 0  coursing  Up to size 7. A size 7 had excellent torsional and rotational  and axial stability. The trial high offset neck was then placed  with a 36 + 1.5 trial head. The hip was then reduced. We confirmed that  the stem was in the canal both on AP and lateral x-rays. It also has excellent sizing. The hip was reduced with outstanding stability through full extension and full external rotation.. AP pelvis was taken and the leg lengths were measured and found to be equal. Hip was then dislocated again and the femoral head and neck removed. The  femoral broach was removed. Size 7 Actis stem with a high offset  neck was then impacted into the femur following native anteversion. Has  excellent purchase in the canal. Excellent torsional and rotational and  axial stability. It is confirmed to be in the canal on AP and lateral  fluoroscopic views. The 36 + 1.5  metal head was placed and the hip  reduced with outstanding stability. Again AP pelvis was taken and it  confirmed that the leg lengths were equal. The wound was then copiously  irrigated with saline solution and the capsule reattached and repaired  with Ethibond suture. 30 ml of .25% Bupivicaine was  injected into the capsule and into the edge of the tensor fascia lata as well as subcutaneous tissue. The fascia overlying the tensor fascia lata was then closed with a running #1 V-Loc. Subcu was closed with interrupted 2-0 Vicryl and subcuticular running 4-0 Monocryl. Incision was cleaned  and dried. Steri-Strips and a bulky sterile dressing applied. Hemovac  drain was hooked to suction and then the patient was awakened and transported to  recovery in stable condition.        Please note that a surgical assistant was a medical necessity for this procedure to perform it in a safe and expeditious manner. Assistant was necessary to provide appropriate retraction of vital neurovascular structures and to prevent femoral fracture and  allow for anatomic placement of the prosthesis.  Gaynelle Arabian, M.D.

## 2017-11-04 NOTE — Anesthesia Procedure Notes (Signed)
Procedure Name: MAC Date/Time: 11/04/2017 2:06 PM Performed by: Dione Booze, CRNA Pre-anesthesia Checklist: Patient identified, Emergency Drugs available, Suction available and Patient being monitored Patient Re-evaluated:Patient Re-evaluated prior to induction Oxygen Delivery Method: Simple face mask Preoxygenation: Pre-oxygenation with 100% oxygen Placement Confirmation: positive ETCO2

## 2017-11-04 NOTE — Transfer of Care (Signed)
Immediate Anesthesia Transfer of Care Note  Patient: Nathaniel Henry  Procedure(s) Performed: RIGHT TOTAL HIP ARTHROPLASTY ANTERIOR APPROACH (Right Hip)  Patient Location: PACU  Anesthesia Type:MAC and Spinal  Level of Consciousness: awake and patient cooperative  Airway & Oxygen Therapy: Patient Spontanous Breathing and Patient connected to face mask oxygen  Post-op Assessment: Report given to RN and Post -op Vital signs reviewed and stable  Post vital signs: Reviewed and stable  Last Vitals:  Vitals Value Taken Time  BP 120/80 11/04/2017  4:22 PM  Temp    Pulse 131 11/04/2017  4:25 PM  Resp 18 11/04/2017  4:25 PM  SpO2 84 % 11/04/2017  4:25 PM  Vitals shown include unvalidated device data.  Last Pain:  Vitals:   11/04/17 1248  TempSrc: Oral         Complications: No apparent anesthesia complications

## 2017-11-04 NOTE — Anesthesia Procedure Notes (Signed)
Spinal  Patient location during procedure: OR Start time: 11/04/2017 2:02 PM End time: 11/04/2017 2:10 PM Staffing Anesthesiologist: Myrtie Soman, MD Performed: anesthesiologist  Preanesthetic Checklist Completed: patient identified, site marked, surgical consent, pre-op evaluation, timeout performed, IV checked, risks and benefits discussed and monitors and equipment checked Spinal Block Patient position: sitting Prep: Betadine Patient monitoring: heart rate, continuous pulse ox and blood pressure Location: L3-4 Injection technique: single-shot Needle Needle type: Sprotte  Needle gauge: 24 G Needle length: 9 cm Additional Notes Expiration date of kit checked and confirmed. Patient tolerated procedure well, without complications.

## 2017-11-04 NOTE — Plan of Care (Signed)

## 2017-11-04 NOTE — Interval H&P Note (Signed)
History and Physical Interval Note:  11/04/2017 1:51 PM  Nathaniel Henry  has presented today for surgery, with the diagnosis of right hip osteoarthritis  The various methods of treatment have been discussed with the patient and family. After consideration of risks, benefits and other options for treatment, the patient has consented to  Procedure(s): RIGHT TOTAL HIP ARTHROPLASTY ANTERIOR APPROACH (Right) as a surgical intervention .  The patient's history has been reviewed, patient examined, no change in status, stable for surgery.  I have reviewed the patient's chart and labs.  Questions were answered to the patient's satisfaction.     Pilar Plate Naara Kelty

## 2017-11-04 NOTE — Anesthesia Preprocedure Evaluation (Signed)
Anesthesia Evaluation  Patient identified by MRN, date of birth, ID band Patient awake    Reviewed: Allergy & Precautions, NPO status , Patient's Chart, lab work & pertinent test results  Airway Mallampati: II  TM Distance: >3 FB Neck ROM: Full    Dental no notable dental hx.    Pulmonary neg pulmonary ROS,    Pulmonary exam normal breath sounds clear to auscultation       Cardiovascular hypertension, Normal cardiovascular exam Rhythm:Regular Rate:Normal     Neuro/Psych Chronic narcotic use negative psych ROS   GI/Hepatic Neg liver ROS, GERD  ,  Endo/Other  diabetesHypothyroidism   Renal/GU negative Renal ROS  negative genitourinary   Musculoskeletal negative musculoskeletal ROS (+)   Abdominal   Peds negative pediatric ROS (+)  Hematology negative hematology ROS (+)   Anesthesia Other Findings   Reproductive/Obstetrics negative OB ROS                             Anesthesia Physical Anesthesia Plan  ASA: II  Anesthesia Plan: Spinal   Post-op Pain Management:    Induction: Intravenous  PONV Risk Score and Plan: 1  Airway Management Planned: Simple Face Mask  Additional Equipment:   Intra-op Plan:   Post-operative Plan:   Informed Consent: I have reviewed the patients History and Physical, chart, labs and discussed the procedure including the risks, benefits and alternatives for the proposed anesthesia with the patient or authorized representative who has indicated his/her understanding and acceptance.   Dental advisory given  Plan Discussed with: CRNA and Surgeon  Anesthesia Plan Comments:         Anesthesia Quick Evaluation

## 2017-11-04 NOTE — Progress Notes (Signed)
Patient has fent patch 52mcg on right lower chest wall. Family states its to be changed on Friday.

## 2017-11-04 NOTE — Anesthesia Postprocedure Evaluation (Signed)
Anesthesia Post Note  Patient: Nathaniel Henry  Procedure(s) Performed: RIGHT TOTAL HIP ARTHROPLASTY ANTERIOR APPROACH (Right Hip)     Patient location during evaluation: PACU Anesthesia Type: Spinal Level of consciousness: oriented and awake and alert Pain management: pain level controlled Vital Signs Assessment: post-procedure vital signs reviewed and stable Respiratory status: spontaneous breathing, respiratory function stable and patient connected to nasal cannula oxygen Cardiovascular status: blood pressure returned to baseline and stable Postop Assessment: no headache, no backache and no apparent nausea or vomiting Anesthetic complications: no    Last Vitals:  Vitals:   11/04/17 2028 11/04/17 2132  BP: 124/72 (!) 115/56  Pulse: 78 67  Resp: 16 16  Temp: 36.7 C 36.7 C  SpO2: 99% 99%    Last Pain:  Vitals:   11/04/17 2132  TempSrc: Oral  PainSc:                  Kierstin January S

## 2017-11-05 ENCOUNTER — Encounter (HOSPITAL_COMMUNITY): Payer: Self-pay | Admitting: Orthopedic Surgery

## 2017-11-05 LAB — GLUCOSE, CAPILLARY
GLUCOSE-CAPILLARY: 207 mg/dL — AB (ref 70–99)
GLUCOSE-CAPILLARY: 212 mg/dL — AB (ref 70–99)
Glucose-Capillary: 244 mg/dL — ABNORMAL HIGH (ref 70–99)
Glucose-Capillary: 126 mg/dL — ABNORMAL HIGH (ref 70–99)

## 2017-11-05 LAB — BASIC METABOLIC PANEL
ANION GAP: 8 (ref 5–15)
BUN: 29 mg/dL — ABNORMAL HIGH (ref 8–23)
CALCIUM: 8.7 mg/dL — AB (ref 8.9–10.3)
CO2: 25 mmol/L (ref 22–32)
Chloride: 102 mmol/L (ref 98–111)
Creatinine, Ser: 1.12 mg/dL (ref 0.61–1.24)
GFR calc Af Amer: >60 mL/min (ref >60)
GFR, EST NON AFRICAN AMERICAN: 59 mL/min — AB (ref >60)
GLUCOSE: 276 mg/dL — AB (ref 70–99)
Potassium: 4.7 mmol/L (ref 3.5–5.1)
Sodium: 135 mmol/L (ref 135–145)

## 2017-11-05 LAB — CBC
HEMATOCRIT: 26.6 % — AB (ref 39.0–52.0)
Hemoglobin: 9.4 g/dL — ABNORMAL LOW (ref 13.0–17.0)
MCH: 33 pg (ref 26.0–34.0)
MCHC: 35.3 g/dL (ref 30.0–36.0)
MCV: 93.3 fL (ref 78.0–100.0)
PLATELETS: 214 10*3/uL (ref 150–400)
RBC: 2.85 MIL/uL — ABNORMAL LOW (ref 4.22–5.81)
RDW: 12.6 % (ref 11.5–15.5)
WBC: 10.8 10*3/uL — AB (ref 4.0–10.5)

## 2017-11-05 NOTE — Plan of Care (Signed)
Plan of care discussed.   

## 2017-11-05 NOTE — NC FL2 (Signed)
[place] LEVEL OF CARE SCREENING TOOL     IDENTIFICATION  Patient Name: Nathaniel Henry Birthdate: [birth year] Sex: male Admission Date (Current Location): 11/04/2017  [facility] and [place] Number:  Herbalist and Address:  [facility],  [place] [address], [place]      Provider Number: [id number]  Attending Physician Name and Address:  Gaynelle Arabian, MD  Relative Name and Phone Number:       Current Level of Care: Hospital Recommended Level of Care: [place] Prior Approval Number:    Date Approved/Denied:   PASRR Number: [id number]  Discharge Plan:      Current Diagnoses: Patient Active Problem List   Diagnosis Date Noted  . OA (osteoarthritis) of hip 11/04/2017    Orientation RESPIRATION BLADDER Height & Weight     Self, Time, Situation, Place  O2 Continent Weight: 175 lb 5 oz (79.5 kg) Height:  5\' 11"  (180.3 cm)  BEHAVIORAL SYMPTOMS/MOOD NEUROLOGICAL BOWEL NUTRITION STATUS      Continent Diet(Regular)  AMBULATORY STATUS COMMUNICATION OF NEEDS Skin   Extensive Assist Verbally Surgical wounds                       Personal Care Assistance Level of Assistance  Bathing, Feeding, Dressing Bathing Assistance: Limited assistance Feeding assistance: Independent Dressing Assistance: Limited assistance     Functional Limitations Info  Sight, Hearing, Speech Sight Info: Adequate Hearing Info: Adequate Speech Info: Adequate    SPECIAL CARE FACTORS FREQUENCY  PT (By licensed PT), OT (By licensed OT)     PT Frequency: 7x/week OT Frequency: 7x/week            Contractures Contractures Info: Not present    Additional Factors Info  Code Status, Allergies Code Status Info: Fullcode  Allergies Info: No Known Allergies           Current Medications (11/05/2017):  This is the current hospital active medication list Current Facility-Administered Medications  Medication Dose Route Frequency  Provider Last Rate Last Dose  . 0.9 %  sodium chloride infusion   Intravenous Continuous Gaynelle Arabian, MD 100 mL/hr at 11/05/17 0600    . acetaminophen (TYLENOL) tablet 325-650 mg  325-650 mg Oral Q6H PRN Gaynelle Arabian, MD   325 mg at 11/05/17 0749  . atorvastatin (LIPITOR) tablet 10 mg  10 mg Oral QPM Gaynelle Arabian, MD   10 mg at 11/04/17 1959  . bisacodyl (DULCOLAX) suppository 10 mg  10 mg Rectal Daily PRN Gaynelle Arabian, MD      . cromolyn (OPTICROM) 4 % ophthalmic solution 1 drop  1 drop Both Eyes Daily PRN Aluisio, Pilar Plate, MD      . diphenhydrAMINE (BENADRYL) 12.5 MG/5ML elixir 12.5-25 mg  12.5-25 mg Oral Q4H PRN Aluisio, Pilar Plate, MD      . docusate sodium (COLACE) capsule 100 mg  100 mg Oral BID Gaynelle Arabian, MD   100 mg at 11/05/17 0949  . fentaNYL (DURAGESIC - dosed mcg/hr) patch 75 mcg  75 mcg Transdermal Q72H Gaynelle Arabian, MD   75 mcg at 11/05/17 1129  . gabapentin (NEURONTIN) capsule 300 mg  300 mg Oral TID Gaynelle Arabian, MD   300 mg at 11/05/17 0949  . glipiZIDE (GLUCOTROL) tablet 10 mg  10 mg Oral QPC breakfast Gaynelle Arabian, MD   10 mg at 11/05/17 0846  . HYDROcodone-acetaminophen (NORCO) 7.5-325 MG per tablet 1-2 tablet  1-2 tablet Oral Q4H PRN Gaynelle Arabian, MD  1 tablet at 11/05/17 0749  . HYDROcodone-acetaminophen (NORCO/VICODIN) 5-325 MG per tablet 1-2 tablet  1-2 tablet Oral Q4H PRN Aluisio, Pilar Plate, MD      . insulin aspart (novoLOG) injection 0-15 Units  0-15 Units Subcutaneous TID WC Gaynelle Arabian, MD   5 Units at 11/05/17 [phone]  . levothyroxine (SYNTHROID, LEVOTHROID) tablet 75 mcg  75 mcg Oral QAC breakfast Gaynelle Arabian, MD   75 mcg at 11/05/17 0743  . menthol-cetylpyridinium (CEPACOL) lozenge 3 mg  1 lozenge Oral PRN Gaynelle Arabian, MD   3 mg at 11/04/17 2148   Or  . phenol (CHLORASEPTIC) mouth spray 1 spray  1 spray Mouth/Throat PRN Aluisio, Pilar Plate, MD      . methocarbamol (ROBAXIN) tablet 500 mg  500 mg Oral Q6H PRN Gaynelle Arabian, MD   500 mg at 11/05/17 0949    Or  . methocarbamol (ROBAXIN) 500 mg in dextrose 5 % 50 mL IVPB  500 mg Intravenous Q6H PRN Gaynelle Arabian, MD   Stopped at 11/04/17 1837  . metoCLOPramide (REGLAN) tablet 5-10 mg  5-10 mg Oral Q8H PRN Gaynelle Arabian, MD       Or  . metoCLOPramide (REGLAN) injection 5-10 mg  5-10 mg Intravenous Q8H PRN Aluisio, Pilar Plate, MD      . morphine 2 MG/ML injection 0.5-1 mg  0.5-1 mg Intravenous Q2H PRN Aluisio, Pilar Plate, MD      . ondansetron (ZOFRAN) tablet 4 mg  4 mg Oral Q6H PRN Gaynelle Arabian, MD       Or  . ondansetron (ZOFRAN) injection 4 mg  4 mg Intravenous Q6H PRN Aluisio, Pilar Plate, MD      . pantoprazole (PROTONIX) EC tablet 40 mg  40 mg Oral QAC breakfast Gaynelle Arabian, MD   40 mg at 11/05/17 0743  . polyethylene glycol (MIRALAX / GLYCOLAX) packet 17 g  17 g Oral Daily PRN Aluisio, Pilar Plate, MD      . rivaroxaban Alveda Reasons) tablet 10 mg  10 mg Oral Q breakfast Gaynelle Arabian, MD   10 mg at 11/05/17 0743  . sodium phosphate (FLEET) 7-19 GM/118ML enema 1 enema  1 enema Rectal Once PRN Gaynelle Arabian, MD         Discharge Medications: Please see discharge summary for a list of discharge medications.  Relevant Imaging Results:  Relevant Lab Results:   Additional Information ssn:[id number]  Lia Hopping, LCSW

## 2017-11-05 NOTE — Evaluation (Signed)
Physical Therapy Evaluation Patient Details Name: Nathaniel Henry MRN: [id number] DOB: 04-30-1932 Today's Date: 11/05/2017   History of Present Illness  Pt s/p R THR and with advanced OA in L hip as well.  Pt reports spending last several years in bed or WC 2* hip pain.  Pt with hx of chronic pain and DM  Clinical Impression  Pt s/p R THR and presents with decreased R LE strength/ROM, post op pain and premorbid deconditioning limiting functional mobility.  Pt would greatly benefit from follow up rehab at SNF level to maximize IND and safety prior to return home.    Follow Up Recommendations SNF    Equipment Recommendations  None recommended by PT    Recommendations for Other Services       Precautions / Restrictions Precautions Precautions: Fall Restrictions Weight Bearing Restrictions: No Other Position/Activity Restrictions: WBAT      Mobility  Bed Mobility Overal bed mobility: Needs Assistance Bed Mobility: Supine to Sit     Supine to sit: +2 for physical assistance;+2 for safety/equipment;Mod assist;Max assist     General bed mobility comments: cues for sequence and use of L LE to self assist.  Pt required physical assist to manage bilat LE, to bring trunk to upright and to complete transition to EOB using pad  Transfers Overall transfer level: Needs assistance Equipment used: Rolling walker (2 wheeled) Transfers: Sit to/from Stand Sit to Stand: Mod assist;+2 physical assistance;+2 safety/equipment;From elevated surface         General transfer comment: cues for LE management and use of UEs to self assist  Ambulation/Gait Ambulation/Gait assistance: Mod assist;+2 physical assistance;+2 safety/equipment Gait Distance (Feet): 8 Feet Assistive device: Rolling walker (2 wheeled) Gait Pattern/deviations: Step-to pattern;Decreased step length - right;Decreased step length - left;Shuffle;Trunk flexed Gait velocity: decr   General Gait Details: cues for posture,  sequence and position from RW.  Physical assist for balance, support and RW management  Stairs            Wheelchair Mobility    Modified Rankin (Stroke Patients Only)       Balance Overall balance assessment: Needs assistance Sitting-balance support: No upper extremity supported;Feet supported Sitting balance-Leahy Scale: Fair     Standing balance support: Bilateral upper extremity supported Standing balance-Leahy Scale: Poor                               Pertinent Vitals/Pain Pain Assessment: 0-10 Pain Score: 4  Pain Location: R hip Pain Descriptors / Indicators: Aching;Sore Pain Intervention(s): Limited activity within patient's tolerance;Monitored during session;Premedicated before session;Ice applied    Home Living Family/patient expects to be discharged to:: Skilled nursing facility                      Prior Function Level of Independence: Needs assistance         Comments: Pt reports spending last several years in bed or wc 2* hip pain     Hand Dominance        Extremity/Trunk Assessment   Upper Extremity Assessment Upper Extremity Assessment: Generalized weakness    Lower Extremity Assessment Lower Extremity Assessment: RLE deficits/detail;LLE deficits/detail RLE Deficits / Details: 2/5 strength at hip with AAROM at hip to 80 flex and 10 abd LLE Deficits / Details: generalized weakness with AAROM at hip limited to 80 flex and 15 abd    Cervical / Trunk Assessment Cervical / Trunk Assessment:  Kyphotic  Communication   Communication: No difficulties  Cognition Arousal/Alertness: Awake/alert Behavior During Therapy: WFL for tasks assessed/performed Overall Cognitive Status: Within Functional Limits for tasks assessed                                        General Comments      Exercises Total Joint Exercises Ankle Circles/Pumps: AROM;Both;15 reps;Supine Quad Sets: AROM;Both;10 reps;Supine Heel  Slides: AAROM;Right;20 reps;Supine Hip ABduction/ADduction: AAROM;Right;15 reps;Supine   Assessment/Plan    PT Assessment Patient needs continued PT services  PT Problem List Decreased strength;Decreased range of motion;Decreased activity tolerance;Decreased balance;Decreased mobility;Decreased knowledge of use of DME;Pain       PT Treatment Interventions DME instruction;Gait training;Stair training;Functional mobility training;Therapeutic activities;Therapeutic exercise;Patient/family education    PT Goals (Current goals can be found in the Care Plan section)  Acute Rehab PT Goals Patient Stated Goal: Regain IND PT Goal Formulation: With patient Time For Goal Achievement: 11/12/17 Potential to Achieve Goals: Fair    Frequency 7X/week   Barriers to discharge        Co-evaluation               AM-PAC PT "6 Clicks" Daily Activity  Outcome Measure Difficulty turning over in bed (including adjusting bedclothes, sheets and blankets)?: Unable Difficulty moving from lying on back to sitting on the side of the bed? : Unable Difficulty sitting down on and standing up from a chair with arms (e.g., wheelchair, bedside commode, etc,.)?: Unable Help needed moving to and from a bed to chair (including a wheelchair)?: A Lot Help needed walking in hospital room?: A Lot Help needed climbing 3-5 steps with a railing? : Total 6 Click Score: 8    End of Session Equipment Utilized During Treatment: Gait belt Activity Tolerance: Patient tolerated treatment well Patient left: in chair;with call bell/phone within reach;with chair alarm set Nurse Communication: Mobility status PT Visit Diagnosis: Muscle weakness (generalized) (M62.81);Difficulty in walking, not elsewhere classified (R26.2)    Time: 8270-7867 PT Time Calculation (min) (ACUTE ONLY): 33 min   Charges:   PT Evaluation $PT Eval Low Complexity: 1 Low PT Treatments $Therapeutic Exercise: 8-22 mins        Pg [phone]  [phone]   Izzabell Klasen 11/05/2017, 12:57 PM

## 2017-11-05 NOTE — Progress Notes (Signed)
Plan for d/c to SNF, discharge planning per CSW. [phone] 

## 2017-11-05 NOTE — Plan of Care (Signed)
Plan of care discussed with patient 
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
## 2017-11-05 NOTE — Progress Notes (Signed)
Physical Therapy Treatment Patient Details Name: Nathaniel Henry MRN: [id number] DOB: Nov 17, 1932 Today's Date: 11/05/2017    History of Present Illness Pt s/p R THR and with advanced OA in L hip as well.  Pt reports spending last several years in bed or WC 2* hip pain.  Pt with hx of chronic pain and DM    PT Comments    Pt cooperative but continues ltd by premorbid deconditioning   Follow Up Recommendations  SNF     Equipment Recommendations  None recommended by PT    Recommendations for Other Services       Precautions / Restrictions Precautions Precautions: Fall Restrictions Weight Bearing Restrictions: No Other Position/Activity Restrictions: WBAT    Mobility  Bed Mobility Overal bed mobility: Needs Assistance Bed Mobility: Sit to Supine     Supine to sit: +2 for physical assistance;+2 for safety/equipment;Mod assist;Max assist Sit to supine: Mod assist;Max assist;+2 for physical assistance;+2 for safety/equipment   General bed mobility comments: cues for sequence and use of L LE to self assist.  Pt required physical assist to manage bilat LE and to control trunk   Transfers Overall transfer level: Needs assistance Equipment used: Rolling walker (2 wheeled) Transfers: Sit to/from Stand Sit to Stand: Mod assist;+2 physical assistance;+2 safety/equipment;From elevated surface         General transfer comment: cues for LE management and use of UEs to self assist  Ambulation/Gait Ambulation/Gait assistance: Mod assist;+2 physical assistance;+2 safety/equipment Gait Distance (Feet): 5 Feet Assistive device: Rolling walker (2 wheeled) Gait Pattern/deviations: Step-to pattern;Decreased step length - right;Decreased step length - left;Shuffle;Trunk flexed Gait velocity: decr   General Gait Details: cues for posture, sequence and position from RW.  Physical assist for balance, support and RW management   Stairs             Wheelchair Mobility     Modified Rankin (Stroke Patients Only)       Balance Overall balance assessment: Needs assistance Sitting-balance support: No upper extremity supported;Feet supported Sitting balance-Leahy Scale: Fair     Standing balance support: Bilateral upper extremity supported Standing balance-Leahy Scale: Poor                              Cognition Arousal/Alertness: Awake/alert Behavior During Therapy: WFL for tasks assessed/performed Overall Cognitive Status: Within Functional Limits for tasks assessed                                        Exercises Total Joint Exercises Ankle Circles/Pumps: AROM;Both;15 reps;Supine Quad Sets: AROM;Both;10 reps;Supine Heel Slides: AAROM;Right;20 reps;Supine Hip ABduction/ADduction: AAROM;Right;15 reps;Supine    General Comments        Pertinent Vitals/Pain Pain Assessment: 0-10 Pain Score: 4  Pain Location: R hip Pain Descriptors / Indicators: Aching;Sore Pain Intervention(s): Limited activity within patient's tolerance;Monitored during session;Premedicated before session;Ice applied    Home Living Family/patient expects to be discharged to:: Skilled nursing facility                    Prior Function Level of Independence: Needs assistance      Comments: Pt reports spending last several years in bed or wc 2* hip pain   PT Goals (current goals can now be found in the care plan section) Acute Rehab PT Goals Patient Stated Goal: Regain IND PT Goal  Formulation: With patient Time For Goal Achievement: 11/12/17 Potential to Achieve Goals: Fair Progress towards PT goals: Progressing toward goals    Frequency    7X/week      PT Plan Current plan remains appropriate    Co-evaluation              AM-PAC PT "6 Clicks" Daily Activity  Outcome Measure  Difficulty turning over in bed (including adjusting bedclothes, sheets and blankets)?: Unable Difficulty moving from lying on back to  sitting on the side of the bed? : Unable Difficulty sitting down on and standing up from a chair with arms (e.g., wheelchair, bedside commode, etc,.)?: Unable Help needed moving to and from a bed to chair (including a wheelchair)?: A Lot Help needed walking in hospital room?: A Lot Help needed climbing 3-5 steps with a railing? : Total 6 Click Score: 8    End of Session Equipment Utilized During Treatment: Gait belt Activity Tolerance: Patient tolerated treatment well Patient left: in bed;with call bell/phone within reach;with bed alarm set Nurse Communication: Mobility status PT Visit Diagnosis: Muscle weakness (generalized) (M62.81);Difficulty in walking, not elsewhere classified (R26.2)     Time: 5102-5852 PT Time Calculation (min) (ACUTE ONLY): 17 min  Charges:  $Therapeutic Exercise: 8-22 mins $Therapeutic Activity: 8-22 mins                     Pg [phone]    Nathaniel Henry 11/05/2017, 3:24 PM

## 2017-11-05 NOTE — Clinical Social Work Note (Addendum)
Clinical Social Work Assessment  Patient Details  Name: Nathaniel Henry MRN: [id number] Date of Birth: 1933/02/02  Date of referral:  11/05/17               Reason for consult:  Facility Placement                Permission sought to share information with:  Facility Sport and exercise psychologist, Family Supports, Case Manager Permission granted to share information::  Yes, Verbal Permission Granted  Name::       [place], [place]  Agency::  SNF   Relationship::   Spouse   Contact Information:    [phone]  Housing/Transportation Living arrangements for the past 2 months:  [place] of Information:  Patient Patient Interpreter Needed:  None Criminal Activity/Legal Involvement Pertinent to Current Situation/Hospitalization:  No - Comment as needed Significant Relationships:  Spouse Lives with:  Spouse Do you feel safe going back to the place where you live?  Yes Need for family participation in patient care:  Yes (Comment)  Care giving concerns:   Right Hip pain, surgical intervention completed. Patient will discharge to SNF for short rehab  Social Worker assessment / plan:  CSW met with patient at bedside, explain role and reason for visit -to assist with discharge plan to [place]. Patient prearranged with the surgical team.  CSW completed FL2. CSW confirm admission coordinator aware and ready to accept the patient.   Plan: SNF.   Employment status:  Retired Forensic scientist:  Medicare PT Recommendations:  [place] / Referral to community resources:  [place]  Patient/Family's Response to care:  Agreeable and Responding well to care.   Patient/Family's Understanding of and Emotional Response to Diagnosis, Current Treatment, and Prognosis:  Patient has a good understanding of his diagnosis and follow up treatment plan.   Emotional Assessment Appearance:  Appears stated age Attitude/Demeanor/Rapport:     Affect (typically observed):  Accepting, Pleasant Orientation:  Oriented to Self, Oriented to Place, Oriented to  Time, Oriented to Situation Alcohol / Substance use:  Not Applicable Psych involvement (Current and /or in the community):  No (Comment)  Discharge Needs  Concerns to be addressed:  Discharge Planning Concerns Readmission within the last 30 days:  No Current discharge risk:  Dependent with Mobility Barriers to Discharge:  No Barriers Identified   Lia Hopping, LCSW 11/05/2017, 11:17 AM

## 2017-11-06 LAB — CBC
HEMATOCRIT: 25.8 % — AB (ref 39.0–52.0)
Hemoglobin: 9 g/dL — ABNORMAL LOW (ref 13.0–17.0)
MCH: 32.5 pg (ref 26.0–34.0)
MCHC: 34.9 g/dL (ref 30.0–36.0)
MCV: 93.1 fL (ref 78.0–100.0)
PLATELETS: 213 10*3/uL (ref 150–400)
RBC: 2.77 MIL/uL — AB (ref 4.22–5.81)
RDW: 12.8 % (ref 11.5–15.5)
WBC: 11.1 10*3/uL — ABNORMAL HIGH (ref 4.0–10.5)

## 2017-11-06 LAB — BASIC METABOLIC PANEL
Anion gap: 8 (ref 5–15)
BUN: 35 mg/dL — ABNORMAL HIGH (ref 8–23)
CO2: 27 mmol/L (ref 22–32)
Calcium: 9.1 mg/dL (ref 8.9–10.3)
Chloride: 105 mmol/L (ref 98–111)
Creatinine, Ser: 1.04 mg/dL (ref 0.61–1.24)
GFR calc Af Amer: >60 mL/min (ref >60)
Glucose, Bld: 164 mg/dL — ABNORMAL HIGH (ref 70–99)
POTASSIUM: 4.8 mmol/L (ref 3.5–5.1)
Sodium: 140 mmol/L (ref 135–145)

## 2017-11-06 LAB — GLUCOSE, CAPILLARY
GLUCOSE-CAPILLARY: 144 mg/dL — AB (ref 70–99)
GLUCOSE-CAPILLARY: 148 mg/dL — AB (ref 70–99)
Glucose-Capillary: 155 mg/dL — ABNORMAL HIGH (ref 70–99)
Glucose-Capillary: 170 mg/dL — ABNORMAL HIGH (ref 70–99)

## 2017-11-06 MED ORDER — DOCUSATE SODIUM 100 MG PO CAPS
100.0000 mg | ORAL_CAPSULE | Freq: Two times a day (BID) | ORAL | 0 refills | Status: DC
Start: 2017-11-06 — End: 2023-04-07

## 2017-11-06 MED ORDER — RIVAROXABAN 10 MG PO TABS
10.0000 mg | ORAL_TABLET | Freq: Every day | ORAL | 0 refills | Status: DC
Start: 2017-11-06 — End: 2023-04-07

## 2017-11-06 MED ORDER — METHOCARBAMOL 500 MG PO TABS: 500.0000 mg | ORAL_TABLET | Freq: Four times a day (QID) | ORAL | 0 refills | Status: DC | PRN

## 2017-11-06 MED ORDER — POLYETHYLENE GLYCOL 3350 17 G PO PACK: 17.0000 g | PACK | Freq: Every day | ORAL | 0 refills | Status: DC | PRN

## 2017-11-06 MED ORDER — ONDANSETRON HCL 4 MG PO TABS: 4.0000 mg | ORAL_TABLET | Freq: Four times a day (QID) | ORAL | 0 refills | Status: DC | PRN

## 2017-11-06 MED ORDER — HYDROCODONE-ACETAMINOPHEN 5-325 MG PO TABS: 1.0000 | ORAL_TABLET | Freq: Four times a day (QID) | ORAL | 0 refills | Status: DC | PRN

## 2017-11-06 MED ORDER — BISACODYL 10 MG RE SUPP: 10.0000 mg | Freq: Every day | RECTAL | 0 refills | Status: DC | PRN

## 2017-11-06 NOTE — Clinical Social Work Placement (Addendum)
Patient primary insurance is Medicare per SNF billing office. The patient will need 3 night stay. Patient can d/c 8/10.   8/10 Nurse call report to:  [phone]  CLINICAL SOCIAL WORK PLACEMENT  NOTE  Date:  11/06/2017  Patient Details  Name: Nathaniel Henry MRN: [id number] Date of Birth: [birth year]  Clinical Social Work is seeking post-discharge placement for this patient at the [place] level of care (*CSW will initial, date and re-position this form in  chart as items are completed):  No   Patient/family provided with [place] Work Department's list of facilities offering this level of care within the geographic area requested by the patient (or if unable, by the patient's family).  No   Patient/family informed of their freedom to choose among providers that offer the needed level of care, that participate in Medicare, Medicaid or managed care program needed by the patient, have an available bed and are willing to accept the patient.  No   Patient/family informed of [facility]'s ownership interest in [facility] and [facility], as well as of the fact that they are under no obligation to receive care at these facilities.  PASRR submitted to EDS on 11/05/17     PASRR number received on 11/05/17     Existing PASRR number confirmed on       FL2 transmitted to all facilities in geographic area requested by pt/family on       FL2 transmitted to all facilities within larger geographic area on       Patient informed that his/her managed care company has contracts with or will negotiate with certain facilities, including the following:  [facility]         Patient/family informed of bed offers received.  Patient chooses bed at [facility] [place] recommends and patient chooses bed at      Patient to be transferred to [facility] on 11/07/17.  Patient to be transferred to facility by [facility]AR       Patient family notified on 11/06/17 of transfer.  Name of family member notified:        PHYSICIAN    Additional Comment:    _______________________________________________ Lia Hopping, LCSW 11/06/2017, 11:03 AM
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
## 2017-11-06 NOTE — Progress Notes (Signed)
Pt is being transferred from [place] to [place] d/t low census. Report was given to Amberley, RN ([place]).

## 2017-11-06 NOTE — Progress Notes (Signed)
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  Pt reports spending last several years in bed or WC 2* hip pain.  Pt with hx of chronic pain and DM    PT Comments    Pt fatigued up in chair and assisted back to bed - mod assist of 2 required.  Follow Up Recommendations  SNF     Equipment Recommendations  None recommended by PT    Recommendations for Other Services       Precautions / Restrictions Precautions Precautions: Fall Restrictions Weight Bearing Restrictions: No Other Position/Activity Restrictions: WBAT    Mobility  Bed Mobility Overal bed mobility: Needs Assistance Bed Mobility: Sit to Supine     Supine to sit: +2 for physical assistance;+2 for safety/equipment;Mod assist Sit to supine: Mod assist;+2 for physical assistance;+2 for safety/equipment   General bed mobility comments: cues for sequence and use of L LE to self assist.  Pt required physical assist to manage bilat LE and to control trunk   Transfers Overall transfer level: Needs assistance Equipment used: Rolling walker (2 wheeled) Transfers: Sit to/from Stand Sit to Stand: Mod assist;+2 physical assistance;+2 safety/equipment;From elevated surface         General transfer comment: cues for LE management and use of UEs to self assist  Ambulation/Gait Ambulation/Gait assistance: Mod assist;+2 physical assistance;+2 safety/equipment Gait Distance (Feet): 5 Feet Assistive device: Rolling walker (2 wheeled) Gait Pattern/deviations: Step-to pattern;Decreased step length - right;Decreased step length - left;Shuffle;Trunk flexed Gait velocity: decr   General Gait Details: cues for posture, sequence and position from RW.  Physical assist for balance, support and RW management   Stairs             Wheelchair Mobility    Modified  Rankin (Stroke Patients Only)       Balance Overall balance assessment: Needs assistance Sitting-balance support: No upper extremity supported;Feet supported Sitting balance-Leahy Scale: Good     Standing balance support: Bilateral upper extremity supported Standing balance-Leahy Scale: Poor                              Cognition Arousal/Alertness: Awake/alert Behavior During Therapy: WFL for tasks assessed/performed Overall Cognitive Status: Within Functional Limits for tasks assessed                                        Exercises Total Joint Exercises Ankle Circles/Pumps: AROM;Both;15 reps;Supine Quad Sets: AROM;Both;10 reps;Supine Heel Slides: AAROM;Right;20 reps;Supine Hip ABduction/ADduction: AAROM;Right;15 reps;Supine    General Comments        Pertinent Vitals/Pain Pain Assessment: 0-10 Pain Score: 4  Pain Location: R hip Pain Descriptors / Indicators: Aching;Sore Pain Intervention(s): Limited activity within patient's tolerance;Monitored during session;Premedicated before session    Home Living                      Prior Function            PT Goals (current goals can now be found in the care plan section) Acute Rehab PT Goals Patient Stated Goal: Regain IND PT Goal Formulation: With patient Time For Goal Achievement: 11/12/17 Potential to Achieve Goals: Fair Progress towards PT goals: Progressing  toward goals    Frequency    7X/week      PT Plan Current plan remains appropriate    Co-evaluation              AM-PAC PT "6 Clicks" Daily Activity  Outcome Measure  Difficulty turning over in bed (including adjusting bedclothes, sheets and blankets)?: Unable Difficulty moving from lying on back to sitting on the side of the bed? : Unable Difficulty sitting down on and standing up from a chair with arms (e.g., wheelchair, bedside commode, etc,.)?: Unable Help needed moving to and from a bed to chair  (including a wheelchair)?: A Lot Help needed walking in hospital room?: A Lot Help needed climbing 3-5 steps with a railing? : Total 6 Click Score: 8    End of Session Equipment Utilized During Treatment: Gait belt Activity Tolerance: Patient tolerated treatment well;Patient limited by fatigue Patient left: with call bell/phone within reach;in chair;with chair alarm set;with family/visitor present Nurse Communication: Mobility status PT Visit Diagnosis: Muscle weakness (generalized) (M62.81);Difficulty in walking, not elsewhere classified (R26.2)     Time: 3664-4034 PT Time Calculation (min) (ACUTE ONLY): 15 min  Charges:  $Gait Training: 8-22 mins $Therapeutic Exercise: 8-22 mins $Therapeutic Activity: 8-22 mins                     Pg [phone]    Debara Kamphuis 11/06/2017, 12:56 PM

## 2017-11-06 NOTE — Progress Notes (Signed)
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  Pt reports spending last several years in bed or WC 2* hip pain.  Pt with hx of chronic pain and DM    PT Comments    Pt continues cooperative and demonstrating improvement in performance of all mobility tasks.   Follow Up Recommendations  SNF     Equipment Recommendations  None recommended by PT    Recommendations for Other Services       Precautions / Restrictions Precautions Precautions: Fall Restrictions Weight Bearing Restrictions: No Other Position/Activity Restrictions: WBAT    Mobility  Bed Mobility Overal bed mobility: Needs Assistance Bed Mobility: Supine to Sit     Supine to sit: +2 for physical assistance;+2 for safety/equipment;Mod assist     General bed mobility comments: cues for sequence and use of L LE to self assist.  Pt required physical assist to manage bilat LE and to control trunk   Transfers Overall transfer level: Needs assistance Equipment used: Rolling walker (2 wheeled) Transfers: Sit to/from Stand Sit to Stand: Mod assist;+2 physical assistance;+2 safety/equipment;From elevated surface         General transfer comment: cues for LE management and use of UEs to self assist  Ambulation/Gait Ambulation/Gait assistance: Mod assist;+2 physical assistance;+2 safety/equipment Gait Distance (Feet): 12 Feet Assistive device: Rolling walker (2 wheeled) Gait Pattern/deviations: Step-to pattern;Decreased step length - right;Decreased step length - left;Shuffle;Trunk flexed Gait velocity: decr   General Gait Details: cues for posture, sequence and position from RW.  Physical assist for balance, support and RW management   Stairs             Wheelchair Mobility    Modified Rankin (Stroke Patients Only)       Balance Overall  balance assessment: Needs assistance Sitting-balance support: No upper extremity supported;Feet supported Sitting balance-Leahy Scale: Good     Standing balance support: Bilateral upper extremity supported Standing balance-Leahy Scale: Poor                              Cognition Arousal/Alertness: Awake/alert Behavior During Therapy: WFL for tasks assessed/performed Overall Cognitive Status: Within Functional Limits for tasks assessed                                        Exercises Total Joint Exercises Ankle Circles/Pumps: AROM;Both;15 reps;Supine Quad Sets: AROM;Both;10 reps;Supine Heel Slides: AAROM;Right;20 reps;Supine Hip ABduction/ADduction: AAROM;Right;15 reps;Supine    General Comments        Pertinent Vitals/Pain Pain Assessment: 0-10 Pain Score: 4  Pain Location: R hip Pain Descriptors / Indicators: Aching;Sore Pain Intervention(s): Limited activity within patient's tolerance;Monitored during session;Premedicated before session;Ice applied    Home Living                      Prior Function            PT Goals (current goals can now be found in the care plan section) Acute Rehab PT Goals Patient Stated Goal: Regain IND PT Goal Formulation: With patient Time For Goal Achievement: 11/12/17 Potential to Achieve Goals: Fair Progress towards PT goals: Progressing toward goals    Frequency    7X/week  PT Plan Current plan remains appropriate    Co-evaluation              AM-PAC PT "6 Clicks" Daily Activity  Outcome Measure  Difficulty turning over in bed (including adjusting bedclothes, sheets and blankets)?: Unable Difficulty moving from lying on back to sitting on the side of the bed? : Unable Difficulty sitting down on and standing up from a chair with arms (e.g., wheelchair, bedside commode, etc,.)?: Unable Help needed moving to and from a bed to chair (including a wheelchair)?: A Lot Help needed  walking in hospital room?: A Lot Help needed climbing 3-5 steps with a railing? : Total 6 Click Score: 8    End of Session Equipment Utilized During Treatment: Gait belt Activity Tolerance: Patient tolerated treatment well Patient left: with call bell/phone within reach;in chair;with chair alarm set;with family/visitor present Nurse Communication: Mobility status PT Visit Diagnosis: Muscle weakness (generalized) (M62.81);Difficulty in walking, not elsewhere classified (R26.2)     Time: 6195-0932 PT Time Calculation (min) (ACUTE ONLY): 29 min  Charges:  $Gait Training: 8-22 mins $Therapeutic Exercise: 8-22 mins                     Pg [phone]    Tiare Rohlman 11/06/2017, 12:52 PM

## 2017-11-06 NOTE — Progress Notes (Signed)
Report received from [facility] on 3E. Pt received in bed pod2 s/p Rt THA, anterior approach. Dressing to right hip in place with some old drainage. WBAT, has condom cath in place. Pt states his stomach feels a bit "uneasy" but not nauseous. No complaints of pain at this time. Will continue to monitor.

## 2017-11-07 LAB — GLUCOSE, CAPILLARY
Glucose-Capillary: 149 mg/dL — ABNORMAL HIGH (ref 70–99)
Glucose-Capillary: 186 mg/dL — ABNORMAL HIGH (ref 70–99)

## 2017-11-07 LAB — CBC
HCT: 24.6 % — ABNORMAL LOW (ref 39.0–52.0)
Hemoglobin: 8.4 g/dL — ABNORMAL LOW (ref 13.0–17.0)
MCH: 32.1 pg (ref 26.0–34.0)
MCHC: 34.1 g/dL (ref 30.0–36.0)
MCV: 93.9 fL (ref 78.0–100.0)
Platelets: 205 10*3/uL (ref 150–400)
RBC: 2.62 MIL/uL — ABNORMAL LOW (ref 4.22–5.81)
RDW: 13 % (ref 11.5–15.5)
WBC: 10.5 10*3/uL (ref 4.0–10.5)

## 2017-11-07 NOTE — Progress Notes (Signed)
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  Pt reports spending last several years in bed or WC 2* hip pain.  Pt with hx of chronic pain and DM    PT Comments    Pt continues cooperative but progressing slowly 2* premorbid deconditioning and c/o R hip/knee pain with WB   Follow Up Recommendations  SNF     Equipment Recommendations  None recommended by PT    Recommendations for Other Services       Precautions / Restrictions Precautions Precautions: Fall Restrictions Weight Bearing Restrictions: No Other Position/Activity Restrictions: WBAT    Mobility  Bed Mobility Overal bed mobility: Needs Assistance Bed Mobility: Sit to Supine     Supine to sit: +2 for physical assistance;+2 for safety/equipment;Min assist;Mod assist Sit to supine: Mod assist;+2 for physical assistance;+2 for safety/equipment   General bed mobility comments: cues for sequence and use of L LE to self assist.  Pt required physical assist to manage bilat LE and to control trunk   Transfers Overall transfer level: Needs assistance Equipment used: Rolling walker (2 wheeled) Transfers: Sit to/from Stand Sit to Stand: Mod assist;+2 physical assistance;+2 safety/equipment;From elevated surface         General transfer comment: cues for LE management and use of UEs to self assist  Ambulation/Gait Ambulation/Gait assistance: Mod assist;+2 physical assistance;+2 safety/equipment Gait Distance (Feet): 4 Feet Assistive device: Rolling walker (2 wheeled) Gait Pattern/deviations: Step-to pattern;Decreased step length - right;Decreased step length - left;Shuffle;Trunk flexed Gait velocity: decr   General Gait Details: cues for posture, sequence, increased UE WB and position from RW.  Physical assist for balance, support and RW  management   Stairs             Wheelchair Mobility    Modified Rankin (Stroke Patients Only)       Balance Overall balance assessment: Needs assistance Sitting-balance support: No upper extremity supported;Feet supported Sitting balance-Leahy Scale: Good     Standing balance support: Bilateral upper extremity supported Standing balance-Leahy Scale: Poor                              Cognition Arousal/Alertness: Awake/alert Behavior During Therapy: WFL for tasks assessed/performed Overall Cognitive Status: Within Functional Limits for tasks assessed                                        Exercises Total Joint Exercises Ankle Circles/Pumps: AROM;Both;15 reps;Supine Quad Sets: AROM;Both;10 reps;Supine Heel Slides: AAROM;Right;20 reps;Supine Hip ABduction/ADduction: AAROM;Right;15 reps;Supine    General Comments        Pertinent Vitals/Pain Pain Assessment: 0-10 Pain Score: 4  Pain Location: R hip and knee with WB Pain Descriptors / Indicators: Aching;Sore Pain Intervention(s): Premedicated before session;Monitored during session;Limited activity within patient's tolerance;Ice applied    Home Living Family/patient expects to be discharged to:: Skilled nursing facility                    Prior Function            PT Goals (current goals can now be found in the care plan section) Acute Rehab PT Goals Patient Stated Goal: Regain IND PT Goal Formulation:  With patient Time For Goal Achievement: 11/12/17 Potential to Achieve Goals: Fair Progress towards PT goals: Progressing toward goals    Frequency    7X/week      PT Plan Current plan remains appropriate    Co-evaluation              AM-PAC PT "6 Clicks" Daily Activity  Outcome Measure  Difficulty turning over in bed (including adjusting bedclothes, sheets and blankets)?: Unable Difficulty moving from lying on back to sitting on the side of the bed?  : Unable Difficulty sitting down on and standing up from a chair with arms (e.g., wheelchair, bedside commode, etc,.)?: Unable Help needed moving to and from a bed to chair (including a wheelchair)?: A Lot Help needed walking in hospital room?: A Lot Help needed climbing 3-5 steps with a railing? : Total 6 Click Score: 8    End of Session Equipment Utilized During Treatment: Gait belt Activity Tolerance: Patient tolerated treatment well;Patient limited by fatigue Patient left: with call bell/phone within reach;in chair;with chair alarm set;with family/visitor present Nurse Communication: Mobility status PT Visit Diagnosis: Muscle weakness (generalized) (M62.81);Difficulty in walking, not elsewhere classified (R26.2)     Time: 8657-8469 PT Time Calculation (min) (ACUTE ONLY): 17 min  Charges:  $Gait Training: 8-22 mins $Therapeutic Exercise: 8-22 mins $Therapeutic Activity: 8-22 mins                     Pg [phone]    Ariam Mol 11/07/2017, 12:53 PM

## 2017-11-07 NOTE — Progress Notes (Addendum)
Pt d/c to [facility]  Via EMS in stable condition with no needs. Rn attempted to call report three times and obtained a voice mail message. Rn even called Education officer, museum at facility to obtain another contact number and none were available. VS stable at time of discharge to facility. Charge RN at facility  is supposed to call [employer] when he is available for report. Packet left at [facility] by EMS crew.

## 2017-11-07 NOTE — Progress Notes (Signed)
Notified [name], SW, by phone re packet left behind. She said she would contact PTAR. Nathaniel Henry, [employer]

## 2017-11-07 NOTE — Progress Notes (Signed)
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  Pt reports spending last several years in bed or WC 2* hip pain.  Pt with hx of chronic pain and DM    PT Comments    Pt continues cooperative but ltd by premorbid deconditioning and c/o R hip/knee pain with WB.   Follow Up Recommendations  SNF     Equipment Recommendations  None recommended by PT    Recommendations for Other Services       Precautions / Restrictions Precautions Precautions: Fall Restrictions Weight Bearing Restrictions: No Other Position/Activity Restrictions: WBAT    Mobility  Bed Mobility Overal bed mobility: Needs Assistance Bed Mobility: Supine to Sit     Supine to sit: +2 for physical assistance;+2 for safety/equipment;Min assist;Mod assist     General bed mobility comments: cues for sequence and use of L LE to self assist.  Pt required physical assist to manage bilat LE and to control trunk   Transfers Overall transfer level: Needs assistance Equipment used: Rolling walker (2 wheeled) Transfers: Sit to/from Stand Sit to Stand: Mod assist;+2 physical assistance;+2 safety/equipment;From elevated surface         General transfer comment: cues for LE management and use of UEs to self assist  Ambulation/Gait Ambulation/Gait assistance: Mod assist;+2 physical assistance;+2 safety/equipment Gait Distance (Feet): 5 Feet Assistive device: Rolling walker (2 wheeled) Gait Pattern/deviations: Step-to pattern;Decreased step length - right;Decreased step length - left;Shuffle;Trunk flexed Gait velocity: decr   General Gait Details: cues for posture, sequence, increased UE WB and position from RW.  Physical assist for balance, support and RW management   Stairs             Wheelchair Mobility    Modified Rankin (Stroke Patients  Only)       Balance Overall balance assessment: Needs assistance Sitting-balance support: No upper extremity supported;Feet supported Sitting balance-Leahy Scale: Good     Standing balance support: Bilateral upper extremity supported Standing balance-Leahy Scale: Poor                              Cognition Arousal/Alertness: Awake/alert Behavior During Therapy: WFL for tasks assessed/performed Overall Cognitive Status: Within Functional Limits for tasks assessed                                        Exercises Total Joint Exercises Ankle Circles/Pumps: AROM;Both;15 reps;Supine Quad Sets: AROM;Both;10 reps;Supine Heel Slides: AAROM;Right;20 reps;Supine Hip ABduction/ADduction: AAROM;Right;15 reps;Supine    General Comments        Pertinent Vitals/Pain Pain Assessment: 0-10 Pain Score: 4  Pain Location: R hip and knee with WB Pain Descriptors / Indicators: Aching;Sore Pain Intervention(s): Limited activity within patient's tolerance;Monitored during session;Premedicated before session;Ice applied    Home Living Family/patient expects to be discharged to:: Skilled nursing facility                    Prior Function            PT Goals (current goals can now be found in the care plan section) Acute Rehab PT Goals Patient Stated Goal: Regain IND PT Goal Formulation: With patient Time For Goal Achievement: 11/12/17 Potential to  Achieve Goals: Fair Progress towards PT goals: Progressing toward goals    Frequency    7X/week      PT Plan Current plan remains appropriate    Co-evaluation              AM-PAC PT "6 Clicks" Daily Activity  Outcome Measure  Difficulty turning over in bed (including adjusting bedclothes, sheets and blankets)?: Unable Difficulty moving from lying on back to sitting on the side of the bed? : Unable Difficulty sitting down on and standing up from a chair with arms (e.g., wheelchair, bedside  commode, etc,.)?: Unable Help needed moving to and from a bed to chair (including a wheelchair)?: A Lot Help needed walking in hospital room?: A Lot Help needed climbing 3-5 steps with a railing? : Total 6 Click Score: 8    End of Session Equipment Utilized During Treatment: Gait belt Activity Tolerance: Patient tolerated treatment well;Patient limited by fatigue Patient left: with call bell/phone within reach;in chair;with chair alarm set;with family/visitor present Nurse Communication: Mobility status PT Visit Diagnosis: Muscle weakness (generalized) (M62.81);Difficulty in walking, not elsewhere classified (R26.2)     Time: 9643-8381 PT Time Calculation (min) (ACUTE ONLY): 31 min  Charges:  $Gait Training: 8-22 mins $Therapeutic Exercise: 8-22 mins                     Pg [phone]    Mahogany Torrance 11/07/2017, 12:49 PM

## 2017-11-07 NOTE — Progress Notes (Signed)
    Subjective:  Patient reports pain as mild to moderate.  Denies N/V/CP/SOB. Had large BM this am.  Objective:   VITALS:   Vitals:   11/05/17 2200 11/06/17 0518 11/06/17 2143 11/07/17 0552  BP: 129/86 (!) 152/64 125/66 128/63  Pulse: 68 64 74 80  Resp: 15 16 16 16   Temp: 98.6 F (37 C) 98 F (36.7 C) 98.2 F (36.8 C) 99.6 F (37.6 C)  TempSrc: Oral Oral Oral Oral  SpO2: 98% 99% 97% 98%  Weight:      Height:        NAD ABD soft Sensation intact distally Intact pulses distally Dorsiflexion/Plantar flexion intact Incision: scant drainage Compartment soft Dressing changed  Lab Results  Component Value Date   WBC 10.5 11/07/2017   HGB 8.4 (L) 11/07/2017   HCT 24.6 (L) 11/07/2017   MCV 93.9 11/07/2017   PLT 205 11/07/2017   BMET    Component Value Date/Time   NA 140 11/06/2017 0526   K 4.8 11/06/2017 0526   CL 105 11/06/2017 0526   CO2 27 11/06/2017 0526   GLUCOSE 164 (H) 11/06/2017 0526   BUN 35 (H) 11/06/2017 0526   CREATININE 1.04 11/06/2017 0526   CALCIUM 9.1 11/06/2017 0526   GFRNONAA >60 11/06/2017 0526   GFRAA >60 11/06/2017 0526     Assessment/Plan: 3 Days Post-Op   Principal Problem:   OA (osteoarthritis) of hip   WBAT with walker DVT ppx: Xarelto, SCDs, TEDS PO pain control PT/OT Dispo: D/C to SNF    Hilton Cork Bailee Thall 11/07/2017, 8:34 AM   Rod Can, MD Cell [phone]

## 2017-11-07 NOTE — Progress Notes (Signed)
SW came to unit & took possession of discharge packet (which includes prescriptions), saying she would meet PTAR at ambulance bay. Guage Efferson, [employer]

## 2020-04-16 IMAGING — DX DG PORTABLE PELVIS
2 series · 2 of 2 positions shown · non-contrast
Comparison: CT abdomen and pelvis January 17, 2014

CLINICAL DATA: RIGHT total hip arthroplasty.

EXAM:
PORTABLE PELVIS 1-2 VIEWS

[pelvis ap (1 of 2)]
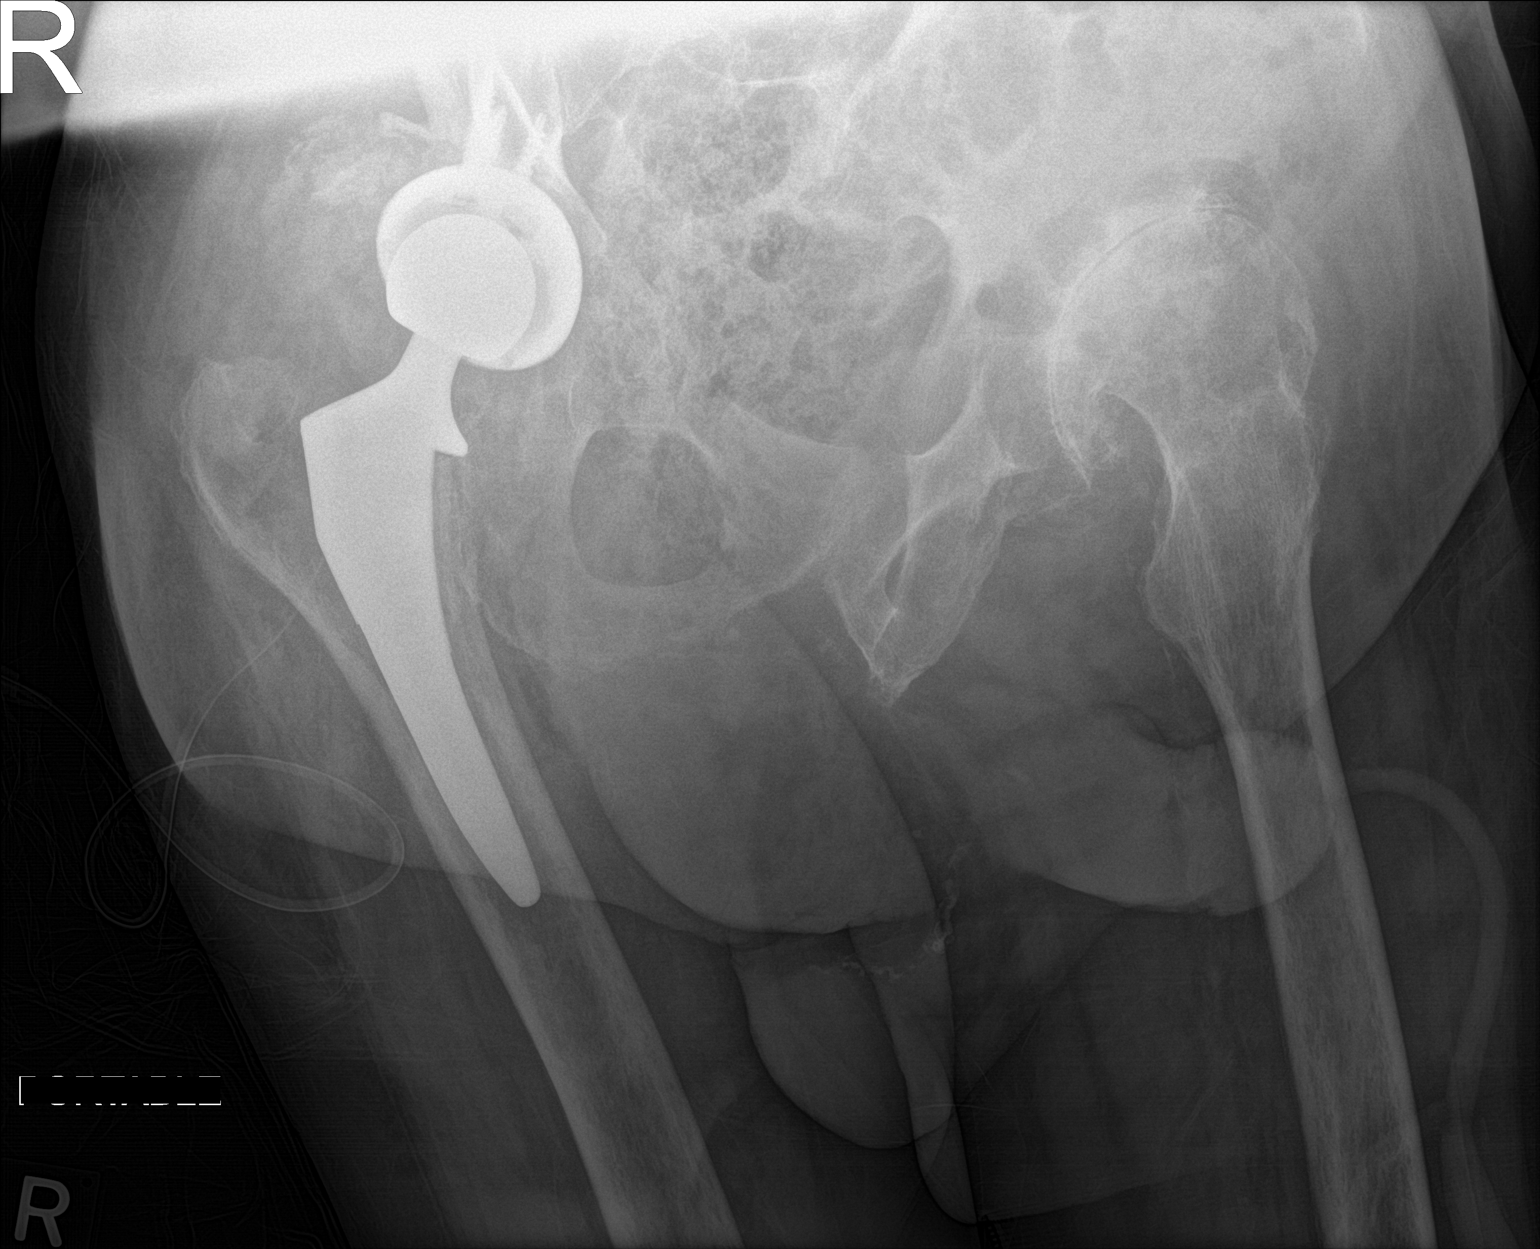

[pelvis ap (2 of 2)]
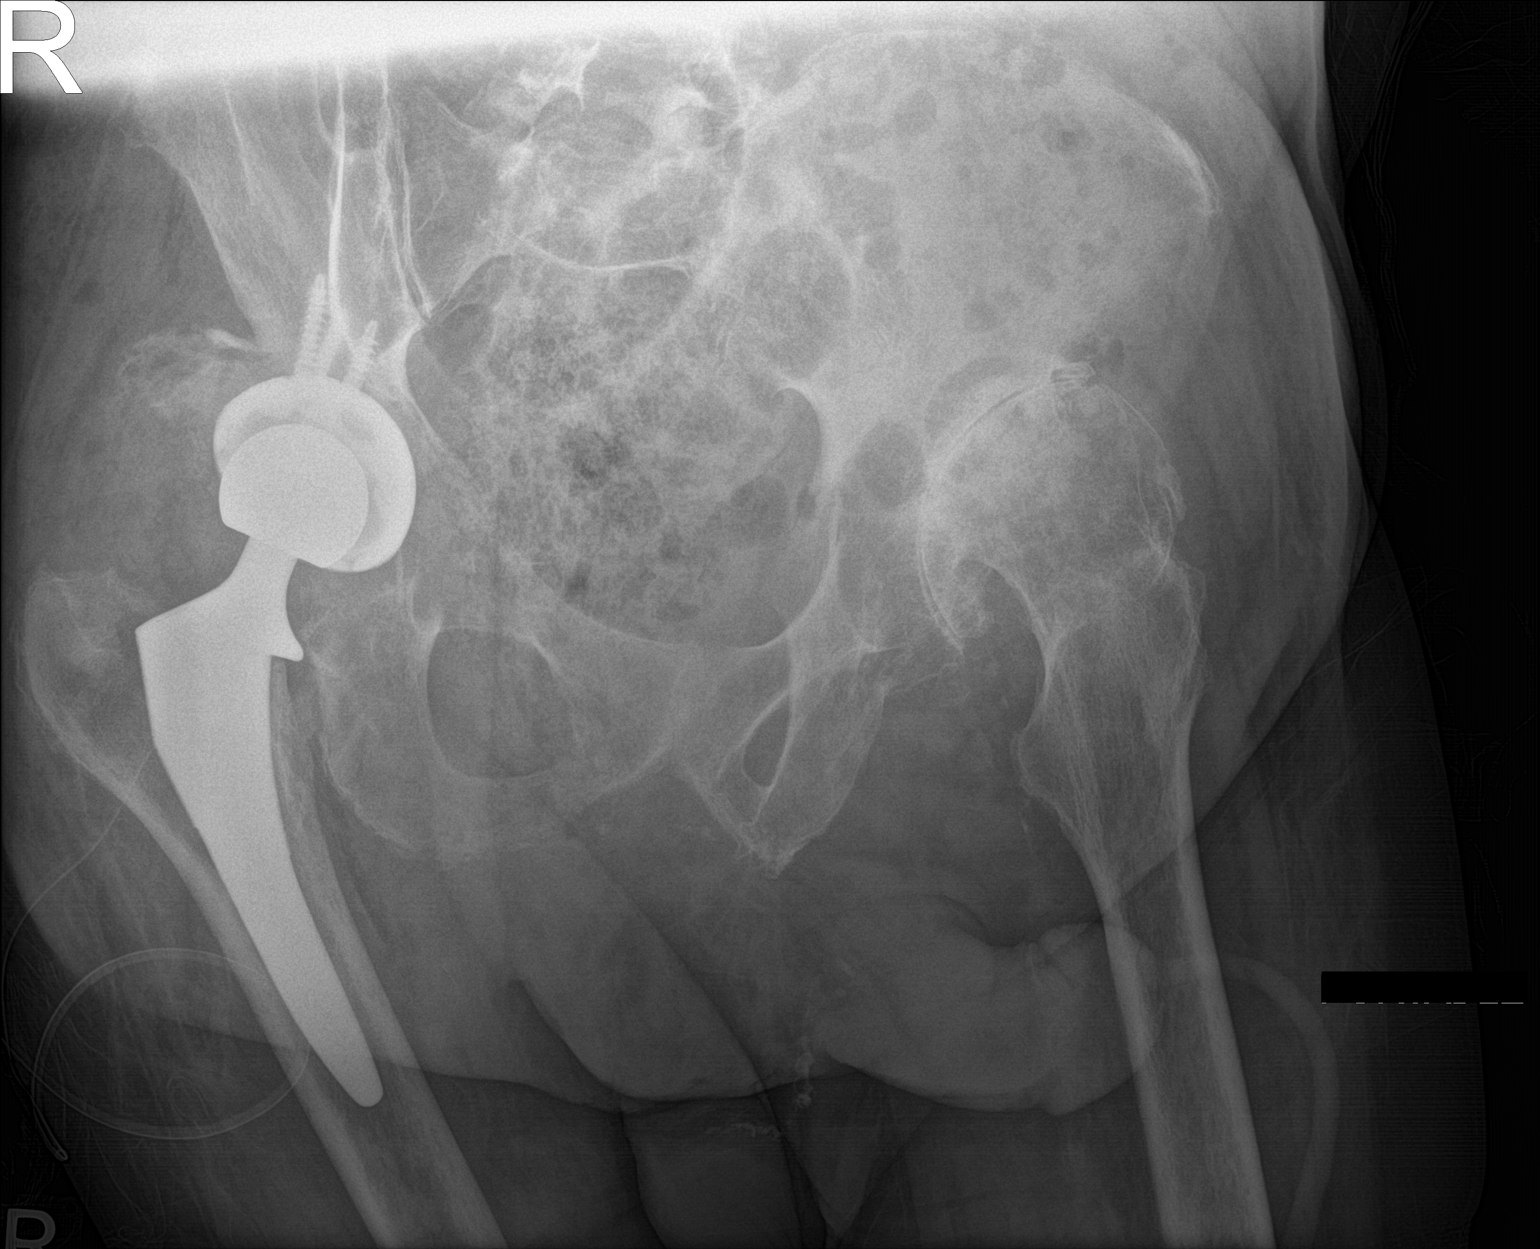

[2 of 2 positions shown; findings below may reference images not displayed]

FINDINGS: Status post RIGHT hip total arthroplasty with intact hardware.
Acetabular cup extends lateral to the acetabula, conforming to prior
shallow acetabula. Femoral stem central within the medullary cavity.
RIGHT hip soft tissue swelling and subcutaneous fat stranding with
surgical drain in place. Severe LEFT hip osteoarthrosis.
IMPRESSION: 1. Status post RIGHT hip total arthroplasty with surgical drain in
place.
2. Severe LEFT hip osteoarthrosis.

## 2021-09-16 DIAGNOSIS — R339 Retention of urine, unspecified: Secondary | ICD-10-CM | POA: Diagnosis present

## 2023-03-27 ENCOUNTER — Inpatient Hospital Stay (HOSPITAL_COMMUNITY)
Admission: EM | Admit: 2023-03-27 | Discharge: 2023-04-07 | DRG: 698 | Disposition: A | Discharge status: Skilled Nursing Facility | Payer: Medicare Other | Attending: Internal Medicine | Admitting: Internal Medicine

## 2023-03-27 ENCOUNTER — Other Ambulatory Visit: Payer: Self-pay

## 2023-03-27 DIAGNOSIS — E039 Hypothyroidism, unspecified: Secondary | ICD-10-CM | POA: Diagnosis present

## 2023-03-27 DIAGNOSIS — F03A0 Unspecified dementia, mild, without behavioral disturbance, psychotic disturbance, mood disturbance, and anxiety: Secondary | ICD-10-CM | POA: Diagnosis present

## 2023-03-27 DIAGNOSIS — D649 Anemia, unspecified: Secondary | ICD-10-CM | POA: Diagnosis present

## 2023-03-27 DIAGNOSIS — D509 Iron deficiency anemia, unspecified: Secondary | ICD-10-CM | POA: Diagnosis present

## 2023-03-27 DIAGNOSIS — N401 Enlarged prostate with lower urinary tract symptoms: Secondary | ICD-10-CM | POA: Diagnosis present

## 2023-03-27 DIAGNOSIS — Z79890 Hormone replacement therapy (postmenopausal): Secondary | ICD-10-CM

## 2023-03-27 DIAGNOSIS — E7800 Pure hypercholesterolemia, unspecified: Secondary | ICD-10-CM | POA: Diagnosis present

## 2023-03-27 DIAGNOSIS — Z978 Presence of other specified devices: Secondary | ICD-10-CM

## 2023-03-27 DIAGNOSIS — T465X5A Adverse effect of other antihypertensive drugs, initial encounter: Secondary | ICD-10-CM | POA: Diagnosis present

## 2023-03-27 DIAGNOSIS — E872 Acidosis, unspecified: Secondary | ICD-10-CM | POA: Diagnosis present

## 2023-03-27 DIAGNOSIS — R1312 Dysphagia, oropharyngeal phase: Secondary | ICD-10-CM | POA: Diagnosis present

## 2023-03-27 DIAGNOSIS — K219 Gastro-esophageal reflux disease without esophagitis: Secondary | ICD-10-CM | POA: Diagnosis present

## 2023-03-27 DIAGNOSIS — Z66 Do not resuscitate: Secondary | ICD-10-CM | POA: Diagnosis present

## 2023-03-27 DIAGNOSIS — E861 Hypovolemia: Secondary | ICD-10-CM | POA: Diagnosis present

## 2023-03-27 DIAGNOSIS — I110 Hypertensive heart disease with heart failure: Secondary | ICD-10-CM | POA: Diagnosis present

## 2023-03-27 DIAGNOSIS — I5032 Chronic diastolic (congestive) heart failure: Secondary | ICD-10-CM | POA: Diagnosis present

## 2023-03-27 DIAGNOSIS — L89153 Pressure ulcer of sacral region, stage 3: Secondary | ICD-10-CM | POA: Diagnosis present

## 2023-03-27 DIAGNOSIS — Z7984 Long term (current) use of oral hypoglycemic drugs: Secondary | ICD-10-CM

## 2023-03-27 DIAGNOSIS — R579 Shock, unspecified: Secondary | ICD-10-CM | POA: Diagnosis present

## 2023-03-27 DIAGNOSIS — I952 Hypotension due to drugs: Secondary | ICD-10-CM

## 2023-03-27 DIAGNOSIS — Z96641 Presence of right artificial hip joint: Secondary | ICD-10-CM | POA: Diagnosis present

## 2023-03-27 DIAGNOSIS — Z751 Person awaiting admission to adequate facility elsewhere: Secondary | ICD-10-CM

## 2023-03-27 DIAGNOSIS — L8992 Pressure ulcer of unspecified site, stage 2: Secondary | ICD-10-CM | POA: Insufficient documentation

## 2023-03-27 DIAGNOSIS — B961 Klebsiella pneumoniae [K. pneumoniae] as the cause of diseases classified elsewhere: Secondary | ICD-10-CM | POA: Diagnosis present

## 2023-03-27 DIAGNOSIS — R823 Hemoglobinuria: Secondary | ICD-10-CM

## 2023-03-27 DIAGNOSIS — G8929 Other chronic pain: Secondary | ICD-10-CM | POA: Diagnosis present

## 2023-03-27 DIAGNOSIS — Z7401 Bed confinement status: Secondary | ICD-10-CM

## 2023-03-27 DIAGNOSIS — N179 Acute kidney failure, unspecified: Secondary | ICD-10-CM

## 2023-03-27 DIAGNOSIS — E119 Type 2 diabetes mellitus without complications: Secondary | ICD-10-CM

## 2023-03-27 DIAGNOSIS — R571 Hypovolemic shock: Secondary | ICD-10-CM | POA: Diagnosis present

## 2023-03-27 DIAGNOSIS — Z79899 Other long term (current) drug therapy: Secondary | ICD-10-CM

## 2023-03-27 DIAGNOSIS — Z85828 Personal history of other malignant neoplasm of skin: Secondary | ICD-10-CM

## 2023-03-27 DIAGNOSIS — T83511A Infection and inflammatory reaction due to indwelling urethral catheter, initial encounter: Secondary | ICD-10-CM | POA: Diagnosis not present

## 2023-03-27 DIAGNOSIS — R195 Other fecal abnormalities: Secondary | ICD-10-CM | POA: Diagnosis present

## 2023-03-27 DIAGNOSIS — Z7409 Other reduced mobility: Secondary | ICD-10-CM | POA: Diagnosis present

## 2023-03-27 DIAGNOSIS — E860 Dehydration: Secondary | ICD-10-CM | POA: Diagnosis present

## 2023-03-27 DIAGNOSIS — N390 Urinary tract infection, site not specified: Principal | ICD-10-CM

## 2023-03-27 DIAGNOSIS — Y846 Urinary catheterization as the cause of abnormal reaction of the patient, or of later complication, without mention of misadventure at the time of the procedure: Secondary | ICD-10-CM | POA: Diagnosis present

## 2023-03-27 DIAGNOSIS — R339 Retention of urine, unspecified: Secondary | ICD-10-CM | POA: Diagnosis present

## 2023-03-27 DIAGNOSIS — R627 Adult failure to thrive: Principal | ICD-10-CM

## 2023-03-27 LAB — CBC WITH DIFFERENTIAL/PLATELET
Abs Immature Granulocytes: 0.02 10*3/uL (ref 0.00–0.07)
Basophils Absolute: 0.1 10*3/uL (ref 0.0–0.1)
Basophils Relative: 1 %
Eosinophils Absolute: 0.2 10*3/uL (ref 0.0–0.5)
Eosinophils Relative: 3 %
HCT: 31.3 % — ABNORMAL LOW (ref 39.0–52.0)
Hemoglobin: 10.1 g/dL — ABNORMAL LOW (ref 13.0–17.0)
Immature Granulocytes: 0 %
Lymphocytes Relative: 20 %
Lymphs Abs: 1.7 10*3/uL (ref 0.7–4.0)
MCH: 28.8 pg (ref 26.0–34.0)
MCHC: 32.3 g/dL (ref 30.0–36.0)
MCV: 89.2 fL (ref 80.0–100.0)
Monocytes Absolute: 0.6 10*3/uL (ref 0.1–1.0)
Monocytes Relative: 7 %
Neutro Abs: 5.9 10*3/uL (ref 1.7–7.7)
Neutrophils Relative %: 69 %
Platelets: 313 10*3/uL (ref 150–400)
RBC: 3.51 MIL/uL — ABNORMAL LOW (ref 4.22–5.81)
RDW: 15 % (ref 11.5–15.5)
WBC: 8.5 10*3/uL (ref 4.0–10.5)
nRBC: 0 % (ref 0.0–0.2)

## 2023-03-27 LAB — COMPREHENSIVE METABOLIC PANEL
ALT: 9 U/L (ref 0–44)
AST: 18 U/L (ref 15–41)
Albumin: 3.4 g/dL — ABNORMAL LOW (ref 3.5–5.0)
Alkaline Phosphatase: 88 U/L (ref 38–126)
Anion gap: 11 (ref 5–15)
BUN: 29 mg/dL — ABNORMAL HIGH (ref 8–23)
CO2: 21 mmol/L — ABNORMAL LOW (ref 22–32)
Calcium: 9.3 mg/dL (ref 8.9–10.3)
Chloride: 103 mmol/L (ref 98–111)
Creatinine, Ser: 1.13 mg/dL (ref 0.61–1.24)
GFR, Estimated: >60 mL/min (ref >60)
Glucose, Bld: 189 mg/dL — ABNORMAL HIGH (ref 70–99)
Potassium: 4.4 mmol/L (ref 3.5–5.1)
Sodium: 135 mmol/L (ref 135–145)
Total Bilirubin: 0.5 mg/dL (ref <1.2)
Total Protein: 6.3 g/dL — ABNORMAL LOW (ref 6.5–8.1)

## 2023-03-27 MED ORDER — TAMSULOSIN HCL 0.4 MG PO CAPS
0.4000 mg | ORAL_CAPSULE | Freq: Every day | ORAL | Status: DC
  Administered 2023-03-27 – 2023-04-03 (×8): 0.4 mg via ORAL
  Filled 2023-03-27 (×8): qty 1

## 2023-03-27 MED ORDER — LISINOPRIL 10 MG PO TABS
5.0000 mg | ORAL_TABLET | Freq: Every day | ORAL | Status: DC
  Administered 2023-03-28 – 2023-04-02 (×5): 5 mg via ORAL
  Filled 2023-03-27 (×5): qty 1

## 2023-03-27 MED ORDER — PANTOPRAZOLE SODIUM 40 MG PO TBEC
80.0000 mg | DELAYED_RELEASE_TABLET | Freq: Every day | ORAL | Status: DC
  Administered 2023-03-27 – 2023-04-07 (×12): 80 mg via ORAL
  Filled 2023-03-27 (×12): qty 2

## 2023-03-27 MED ORDER — GABAPENTIN 300 MG PO CAPS
300.0000 mg | ORAL_CAPSULE | Freq: Three times a day (TID) | ORAL | Status: DC
  Administered 2023-03-27 – 2023-04-07 (×32): 300 mg via ORAL
  Filled 2023-03-27 (×32): qty 1

## 2023-03-27 MED ORDER — LEVOTHYROXINE SODIUM 75 MCG PO TABS
75.0000 ug | ORAL_TABLET | Freq: Every day | ORAL | Status: DC
  Administered 2023-03-28 – 2023-04-07 (×11): 75 ug via ORAL
  Filled 2023-03-27 (×11): qty 1

## 2023-03-27 MED ORDER — GLIPIZIDE 5 MG PO TABS
5.0000 mg | ORAL_TABLET | Freq: Two times a day (BID) | ORAL | Status: DC
  Administered 2023-03-28 – 2023-04-03 (×12): 5 mg via ORAL
  Filled 2023-03-27 (×16): qty 1

## 2023-03-27 MED ORDER — ATORVASTATIN CALCIUM 10 MG PO TABS
10.0000 mg | ORAL_TABLET | Freq: Every evening | ORAL | Status: DC
  Administered 2023-03-27 – 2023-04-06 (×11): 10 mg via ORAL
  Filled 2023-03-27 (×11): qty 1

## 2023-03-27 MED ORDER — METFORMIN HCL 500 MG PO TABS
500.0000 mg | ORAL_TABLET | Freq: Two times a day (BID) | ORAL | Status: DC
  Administered 2023-03-28 – 2023-04-03 (×13): 500 mg via ORAL
  Filled 2023-03-27 (×13): qty 1
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
## 2023-03-27 NOTE — Progress Notes (Signed)
CSW contacted patients son Greig Castilla [phone] to find out why patient was brought to the emergency room. Patients son's wife Tobe Sos answered the phone. CSW was told patiens wife is the primary caretaker. Patients wife was admitted to [facility] and is currently on 2C. Tobe Sos told CSW that she and her husband are visiting from [place] and found both patient and wife in poor conditions. Tobe Sos stated they were going to take patient back to [place] with them but they weren't able to get patient up and into the car. Tobe Sos stated they are currently staying at a hotel and patient wasn't in the condition to come to the hotel either. Patients family is requesting SNF placement due to patient not being able to walk. Patients family stated they can also pay privately for SNF if patients insurance doesn't cover. CSW explained that monthly most SNF's are 8,000-10,000. Tobe Sos told CSW that would not be a problem. Family plans on patient going to SNF then transitioning to [facility] assisted living. PT consult is pending at this time.

## 2023-03-27 NOTE — ED Notes (Signed)
Call the son when there is a plan in place please

## 2023-03-27 NOTE — ED Triage Notes (Signed)
PT BIB EMS from home. Per EMS family concerned that pt is altered however also stated that patient can't take care of himself. Patients wife is his caretaker and she was brought to hospital today and family is unable to care for patient. Pt answers questions appropriately. Pt denies any complaints and stated he disapproved being brought here.    CBG 323 156/78 HR 54  97% RA

## 2023-03-28 ENCOUNTER — Encounter (HOSPITAL_COMMUNITY): Payer: Self-pay

## 2023-03-28 MED ORDER — ACETAMINOPHEN 325 MG PO TABS
650.0000 mg | ORAL_TABLET | Freq: Four times a day (QID) | ORAL | Status: DC | PRN
  Administered 2023-03-28 – 2023-04-07 (×8): 650 mg via ORAL
  Filled 2023-03-28 (×8): qty 2

## 2023-03-28 NOTE — Evaluation (Signed)
Physical Therapy Evaluation Patient Details Name: Nathaniel Henry MRN: [id number] DOB: March 29, 1933 Today's Date: 03/28/2023  History of Present Illness  89 y.o. male who presents 12/27 with concern for failure to thrive. Patient with a PMHx of diabetes, mild dementia, chronic pain,  Clinical Impression  Pt admitted with above diagnosis. Disoriented to month/year/location. Provides inconsistent history that contradicts information he previously provided earlier during evaluation. Pt reportedly w/c bound and is helped by wife at home. She is no longer home and pt cannot adequately care for himself. Per notes, family agreeable to SNF for rehab and hopefully transition to ALF when capable. He required up to mod assist for bed mobility, and max assist for small squat from EOB. States he scoots between surfaces at home (w/c<>toilet). This is difficult to fully assess from a stretcher in the ED since they are quite elevated. Would recommend staff use Stedy for OOB transfers. Pt currently with functional limitations due to the deficits listed below (see PT Problem List). Pt will benefit from acute skilled PT to increase their independence and safety with mobility to allow discharge.           If plan is discharge home, recommend the following: A lot of help with walking and/or transfers;A lot of help with bathing/dressing/bathroom;Assistance with cooking/housework;Direct supervision/assist for medications management;Direct supervision/assist for financial management;Assist for transportation;Supervision due to cognitive status;Help with stairs or ramp for entrance   Can travel by private vehicle   No    Equipment Recommendations None recommended by PT (TBD next venue)  Recommendations for Other Services       Functional Status Assessment Patient has had a recent decline in their functional status and demonstrates the ability to make significant improvements in function in a reasonable and predictable  amount of time.     Precautions / Restrictions Precautions Precautions: Fall Restrictions Weight Bearing Restrictions Per Provider Order: No      Mobility  Bed Mobility Overal bed mobility: Needs Assistance Bed Mobility: Supine to Sit, Sit to Supine     Supine to sit: Min assist, HOB elevated Sit to supine: Mod assist   General bed mobility comments: Min assist for trunk and LEs to EOB, pt able to pull through therapist's hand to rise, demos posterior lean on EOB. Mod assist for LE support back into bed. Able to bridge and scoot up slightly with help.    Transfers Overall transfer level: Needs assistance Equipment used: None Transfers: Sit to/from Stand Sit to Stand: Max assist, From elevated surface           General transfer comment: BIL knee block, squat scoot along bed, partial rise, but unable to stand upright fully. Cues to shift weight anteriorly.    Ambulation/Gait               General Gait Details: unable  Stairs            Wheelchair Mobility     Tilt Bed    Modified Rankin (Stroke Patients Only)       Balance Overall balance assessment: Needs assistance Sitting-balance support: Feet supported, Single extremity supported Sitting balance-Leahy Scale: Poor Sitting balance - Comments: Progressed from min assist to CGA EOB Postural control: Posterior lean                                   Pertinent Vitals/Pain Pain Assessment Pain Assessment: No/denies pain    Home  Living Family/patient expects to be discharged to:: Skilled nursing facility Living Arrangements: Spouse/significant other (wife in hospitals)   Type of Home: House Home Access: Stairs to enter Entrance Stairs-Rails: None Entrance Stairs-Number of Steps: 5     Home Equipment: Wheelchair - manual;Shower seat      Prior Function Prior Level of Function : Needs assist;Patient poor historian/Family not available             Mobility Comments:  W/c bound. Scoot/squat pivot for transfers ADLs Comments: States wife helps with bed mobility, son helps with shower when he visits from [place].     Extremity/Trunk Assessment   Upper Extremity Assessment Upper Extremity Assessment: Defer to OT evaluation    Lower Extremity Assessment Lower Extremity Assessment: Generalized weakness       Communication   Communication Communication: Hearing impairment  Cognition Arousal: Alert Behavior During Therapy: WFL for tasks assessed/performed Overall Cognitive Status: No family/caregiver present to determine baseline cognitive functioning                                 General Comments: Pt not oriented to month/year/location. States he is in the hospital because he was falling down and getting sick. He is oriented to self and his birthdate.        General Comments General comments (skin integrity, edema, etc.): SpO2 100% on RA    Exercises     Assessment/Plan    PT Assessment Patient needs continued PT services  PT Problem List Decreased strength;Decreased activity tolerance;Decreased balance;Decreased mobility;Decreased cognition;Decreased knowledge of use of DME       PT Treatment Interventions DME instruction;Functional mobility training;Therapeutic activities;Balance training;Therapeutic exercise;Neuromuscular re-education;Cognitive remediation;Patient/family education;Wheelchair mobility training    PT Goals (Current goals can be found in the Care Plan section)  Acute Rehab PT Goals Patient Stated Goal: none stated PT Goal Formulation: Patient unable to participate in goal setting Time For Goal Achievement: 04/10/23 Potential to Achieve Goals: Fair    Frequency Min 1X/week     Co-evaluation               AM-PAC PT "6 Clicks" Mobility  Outcome Measure Help needed turning from your back to your side while in a flat bed without using bedrails?: A Little Help needed moving from lying on your  back to sitting on the side of a flat bed without using bedrails?: A Little Help needed moving to and from a bed to a chair (including a wheelchair)?: A Lot Help needed standing up from a chair using your arms (e.g., wheelchair or bedside chair)?: Total Help needed to walk in hospital room?: Total Help needed climbing 3-5 steps with a railing? : Total 6 Click Score: 11    End of Session Equipment Utilized During Treatment: Gait belt Activity Tolerance: Patient tolerated treatment well Patient left: in bed;with call bell/phone within reach Nurse Communication: Mobility status;Need for lift equipment (Spoke with NT caring for patient.) PT Visit Diagnosis: Muscle weakness (generalized) (M62.81);Difficulty in walking, not elsewhere classified (R26.2);Other symptoms and signs involving the nervous system (R29.898);Adult, failure to thrive (R62.7)    Time: 6213-0865 PT Time Calculation (min) (ACUTE ONLY): 19 min   Charges:   PT Evaluation $PT Eval Low Complexity: 1 Low   PT General Charges $$ ACUTE PT VISIT: 1 Visit         Kathlyn Sacramento, PT, DPT [facility]  Rehabilitation Services Physical Therapist Office: [phone] Website: [email or web address]  Berton Mount 03/28/2023, 9:45 AM

## 2023-03-28 NOTE — Progress Notes (Addendum)
2:15pm: CSW spoke with patient's DIL who states the family wishes to accept bed offer from [facility].  CSW spoke with Kia at [facility] to request she initiate insurance authorization.  10:45am: CSW completed FL2 and faxed patient's clinical information out for review to obtain bed offers.  CSW spoke with patient's daughter in law Tobe Sos to inform her of information - she is agreeable to receive bed offers once they become available.  Edwin Dada, MSW, LCSW Transitions of Care  Clinical Social Worker II [phone]

## 2023-03-28 NOTE — ED Notes (Signed)
Patient adjusted in bed; dry linen & brief; no needs stated at this time; comfort measures

## 2023-03-28 NOTE — [place] FL2 (Signed)
[place] MEDICAID FL2 LEVEL OF CARE FORM     IDENTIFICATION  Patient Name: Nathaniel Henry Birthdate: 10-25-1932 Sex: male Admission Date (Current Location): 03/27/2023  [facility] and [place] Number:  Producer, television/film/video and Address:  [facility] [place], [address], [place], [place] [address]      Provider Number: [id number]  Attending Physician Name and Address:  System, Provider Not In  Relative Name and Phone Number:       Current Level of Care: Hospital Recommended Level of Care: Skilled Nursing Facility Prior Approval Number:    Date Approved/Denied:   PASRR Number: [id number]  Discharge Plan: SNF    Current Diagnoses: Patient Active Problem List   Diagnosis Date Noted   OA (osteoarthritis) of hip 11/04/2017    Orientation RESPIRATION BLADDER Height & Weight     Self, Situation, Time, Place  Normal Continent Weight: 174 lb 2.6 oz (79 kg) Height:  5\' 11"  (180.3 cm)  BEHAVIORAL SYMPTOMS/MOOD NEUROLOGICAL BOWEL NUTRITION STATUS      Continent Diet (See discharge summary)  AMBULATORY STATUS COMMUNICATION OF NEEDS Skin   Extensive Assist Verbally Normal                       Personal Care Assistance Level of Assistance  Bathing, Feeding, Dressing Bathing Assistance: Maximum assistance Feeding assistance: Limited assistance Dressing Assistance: Maximum assistance     Functional Limitations Info  Sight, Hearing, Speech Sight Info: Adequate Hearing Info: Adequate Speech Info: Adequate    SPECIAL CARE FACTORS FREQUENCY  OT (By licensed OT), PT (By licensed PT)     PT Frequency: 5x weekly OT Frequency: 5x weekly            Contractures Contractures Info: Not present    Additional Factors Info  Code Status, Allergies Code Status Info: Full Code Allergies Info: No known allergies           Current Medications (03/28/2023):  This is the current hospital active medication list Current Facility-Administered  Medications  Medication Dose Route Frequency Provider Last Rate Last Admin   atorvastatin (LIPITOR) tablet 10 mg  10 mg Oral QPM Pricilla Loveless, MD   10 mg at 03/27/23 2215   gabapentin (NEURONTIN) capsule 300 mg  300 mg Oral TID Pricilla Loveless, MD   300 mg at 03/28/23 0932   glipiZIDE (GLUCOTROL) tablet 5 mg  5 mg Oral BID AC Pricilla Loveless, MD   5 mg at 03/28/23 0932   levothyroxine (SYNTHROID) tablet 75 mcg  75 mcg Oral QAC breakfast Pricilla Loveless, MD   75 mcg at 03/28/23 0932   lisinopril (ZESTRIL) tablet 5 mg  5 mg Oral Daily Pricilla Loveless, MD   5 mg at 03/28/23 0933   metFORMIN (GLUCOPHAGE) tablet 500 mg  500 mg Oral BID WC Pricilla Loveless, MD   500 mg at 03/28/23 0933   pantoprazole (PROTONIX) EC tablet 80 mg  80 mg Oral Daily Pricilla Loveless, MD   80 mg at 03/28/23 0932   tamsulosin (FLOMAX) capsule 0.4 mg  0.4 mg Oral Daily Pricilla Loveless, MD   0.4 mg at 03/28/23 4166   Current Outpatient Medications  Medication Sig Dispense Refill   atorvastatin (LIPITOR) 10 MG tablet Take 10 mg by mouth every evening.     bisacodyl (DULCOLAX) 10 MG suppository Place 1 suppository (10 mg total) rectally daily as needed for moderate constipation. 12 suppository 0   Cholecalciferol (VITAMIN D3) 2000 units TABS Take  2,000 Units by mouth every other day. Alternates between a multivitamin and vitamin d3 supplement     cromolyn (OPTICROM) 4 % ophthalmic solution Place 1 drop into both eyes daily as needed (for eye irritation.).     docusate sodium (COLACE) 100 MG capsule Take 1 capsule (100 mg total) by mouth 2 (two) times daily. 10 capsule 0   fentaNYL (DURAGESIC - DOSED MCG/HR) 75 MCG/HR Place 75 mcg onto the skin every 3 (three) days.     gabapentin (NEURONTIN) 300 MG capsule Take 300 mg by mouth 3 (three) times daily.     glipiZIDE (GLUCOTROL) 10 MG tablet Take 10 mg by mouth daily after breakfast.     HYDROcodone-acetaminophen (NORCO/VICODIN) 5-325 MG tablet Take 1-2 tablets by mouth every 6  (six) hours as needed for moderate pain (pain score 4-6). 56 tablet 0   levothyroxine (SYNTHROID, LEVOTHROID) 75 MCG tablet Take 75 mcg by mouth daily before breakfast.     lisinopril (PRINIVIL,ZESTRIL) 5 MG tablet Take 5 mg by mouth daily.     metFORMIN (GLUCOPHAGE) 500 MG tablet Take 500 mg by mouth daily with breakfast.     methocarbamol (ROBAXIN) 500 MG tablet Take 1 tablet (500 mg total) by mouth every 6 (six) hours as needed for muscle spasms. 40 tablet 0   Multiple Vitamin (MULTIVITAMIN WITH MINERALS) TABS tablet Take 1 tablet by mouth every other day. Alternates between a multivitamin and vitamin d3 supplement     ondansetron (ZOFRAN) 4 MG tablet Take 1 tablet (4 mg total) by mouth every 6 (six) hours as needed for nausea. 20 tablet 0   pantoprazole (PROTONIX) 40 MG tablet Take 40 mg by mouth daily before breakfast.     polyethylene glycol (MIRALAX / GLYCOLAX) packet Take 17 g by mouth daily as needed for mild constipation. 14 each 0   rivaroxaban (XARELTO) 10 MG TABS tablet Take 1 tablet (10 mg total) by mouth daily with breakfast for 19 days. Take one 10 mg xarelto tablet once a day for three weeks following surgery. Then take one 81 mg aspirin once a day for 3 weeks. Then discontinue aspirin. 19 tablet 0     Discharge Medications: Please see discharge summary for a list of discharge medications.  Relevant Imaging Results:  Relevant Lab Results:   Additional Information SSN: [id number]  Inis Sizer, LCSW

## 2023-03-28 NOTE — ED Provider Notes (Signed)
Emergency Medicine Observation Re-evaluation Note  Nathaniel Henry is a 89 y.o. male, seen on rounds today.  Pt initially presented to the ED for complaints of Altered Mental Status Currently, the patient is resting comfortably in bed.  Physical Exam  BP (!) 151/44 (BP Location: Left Leg)   Pulse (!) 54   Temp 98.2 F (36.8 C) (Oral)   Resp 15   Ht 5\' 11"  (1.803 m)   Wt 79 kg   SpO2 96%   BMI 24.29 kg/m  Physical Exam General: Awake, alert Cardiac: Regular rate Lungs: Normal respiratory effort Psych: Calm, cooperative  ED Course / MDM  EKG:   I have reviewed the labs performed to date as well as medications administered while in observation.  Recent changes in the last 24 hours include no acute changes.  Plan  Current plan is for possible placement.    Laurence Spates, MD 03/28/23 [phone]

## 2023-03-29 LAB — CBG MONITORING, ED: Glucose-Capillary: 122 mg/dL — ABNORMAL HIGH (ref 70–99)

## 2023-03-29 NOTE — Progress Notes (Signed)
CSW spoke with Nathaniel Henry at [facility] [employer] who states patient's insurance authorization has not yet been initiated due to the agency being closed. Nathaniel Henry to initiate insurance authorization first thing Monday morning.  CSW attempted to reach [employer] without success - agency is closed on weekends.  Edwin Dada, MSW, LCSW Transitions of Care  Clinical Social Worker II [phone]

## 2023-03-29 NOTE — ED Provider Notes (Signed)
Emergency Medicine Observation Re-evaluation Note  Nathaniel Henry is a 89 y.o. male, seen on rounds today.  Pt initially presented to the ED for complaints of Altered Mental Status Currently, the patient is awake and alert in bed.  Physical Exam  BP (!) 153/64   Pulse (!) 55   Temp 98 F (36.7 C)   Resp 15   Ht 5\' 11"  (1.803 m)   Wt 79 kg   SpO2 100%   BMI 24.29 kg/m  Physical Exam General: Awake, alert Cardiac: Regular rate Lungs: Normal respiratory effort Psych: Calm, cooperative  ED Course / MDM  EKG:   I have reviewed the labs performed to date as well as medications administered while in observation.  Recent changes in the last 24 hours include no changes.  Plan  Current plan is for placement.    Laurence Spates, MD 03/29/23 [phone]

## 2023-03-30 NOTE — ED Notes (Signed)
Pt refused to be turned. Currently turned to left side.

## 2023-03-30 NOTE — ED Notes (Signed)
Pt refused to be turned, currently on left side.

## 2023-03-30 NOTE — ED Notes (Signed)
Pt turned to right side.

## 2023-03-30 NOTE — ED Notes (Addendum)
PT with pt. Pt continues to repetitively, consistently, persistently c/o HA. No relief with meds. No other signs or sx of pain excepter verbal complaint. EDP made aware.

## 2023-03-30 NOTE — Progress Notes (Signed)
Physical Therapy Treatment Patient Details Name: Nathaniel Henry MRN: [id number] DOB: 12-13-1932 Today's Date: 03/30/2023   History of Present Illness 89 y.o. male who presents 12/27 with concern for failure to thrive. Patient with a PMHx of diabetes, mild dementia, chronic pain,    PT Comments  Pt progressing slowly toward goals, limited by significant LE weakness.  Emphasis today on warm up/strengthening ex, transition to EOB, sitting balance challenge, challenging balance and UE function while eating lunch over 20 min period.  Return to supine with mod/max at LE's.  Pt can benefit from therapies <3 hrs /day.   If plan is discharge home, recommend the following: A lot of help with walking and/or transfers;A lot of help with bathing/dressing/bathroom;Assistance with cooking/housework;Direct supervision/assist for medications management;Direct supervision/assist for financial management;Assist for transportation;Supervision due to cognitive status;Help with stairs or ramp for entrance   Can travel by private vehicle     No  Equipment Recommendations  None recommended by PT (TBD next venue)    Recommendations for Other Services       Precautions / Restrictions Precautions Precautions: Fall Restrictions Weight Bearing Restrictions Per Provider Order: No     Mobility  Bed Mobility Overal bed mobility: Needs Assistance Bed Mobility: Supine to Sit, Sit to Supine     Supine to sit: Min assist, HOB elevated Sit to supine: Mod assist (HOB flat.)   General bed mobility comments: Min assist for trunk and LEs to EOB, pt able to pull through therapist's hand to rise, demos posterior lean on EOB. Mod assist for LE support back into bed. Able to bridge and scoot up slightly with help.    Transfers Overall transfer level: Needs assistance Equipment used: None Transfers: Sit to/from Stand, Bed to chair/wheelchair/BSC Sit to Stand: Max assist, From elevated surface           Lateral/Scoot Transfers: Max assist (x2) General transfer comment: As on the eval,  used face to face assist with BIL knees blocked, squat scoot along bed, partial rise, but unable to stand upright fully. Cues for hand placement and for w/shift anteriorly.    Ambulation/Gait               General Gait Details: unable   Stairs             Wheelchair Mobility     Tilt Bed    Modified Rankin (Stroke Patients Only)       Balance Overall balance assessment: Needs assistance Sitting-balance support: Feet supported, Single extremity supported Sitting balance-Leahy Scale: Fair Sitting balance - Comments: Progressed from min assist to CGA EOB and sat up for 20 min at EOB with/without L UE assist while eating lunch. Postural control: Posterior lean, Right lateral lean                                  Cognition Arousal: Alert Behavior During Therapy: WFL for tasks assessed/performed Overall Cognitive Status: No family/caregiver present to determine baseline cognitive functioning                                 General Comments: pt not oriented to place and situation, but follows 1 step commands well.        Exercises      General Comments General comments (skin integrity, edema, etc.): vss on RA.      Pertinent  Vitals/Pain Pain Assessment Pain Assessment: Faces Faces Pain Scale: Hurts even more Pain Location: Head Pain Descriptors / Indicators: Aching Pain Intervention(s): Monitored during session    Home Living Family/patient expects to be discharged to:: Skilled nursing facility Living Arrangements: Spouse/significant other (wife in hospitals)   Type of Home: House Home Access: Stairs to enter Entrance Stairs-Rails: None Entrance Stairs-Number of Steps: 5     Home Equipment: Wheelchair - manual;Shower seat      Prior Function            PT Goals (current goals can now be found in the care plan section) Acute Rehab  PT Goals Patient Stated Goal: none stated PT Goal Formulation: Patient unable to participate in goal setting Time For Goal Achievement: 04/10/23 Potential to Achieve Goals: Fair    Frequency    Min 1X/week      PT Plan      Co-evaluation              AM-PAC PT "6 Clicks" Mobility   Outcome Measure  Help needed turning from your back to your side while in a flat bed without using bedrails?: A Little Help needed moving from lying on your back to sitting on the side of a flat bed without using bedrails?: A Little Help needed moving to and from a bed to a chair (including a wheelchair)?: A Lot Help needed standing up from a chair using your arms (e.g., wheelchair or bedside chair)?: Total Help needed to walk in hospital room?: Total Help needed climbing 3-5 steps with a railing? : Total 6 Click Score: 11    End of Session Equipment Utilized During Treatment: Gait belt Activity Tolerance: Patient tolerated treatment well Patient left: in bed;with call bell/phone within reach Nurse Communication: Mobility status;Need for lift equipment (Spoke with NT caring for patient.) PT Visit Diagnosis: Muscle weakness (generalized) (M62.81);Difficulty in walking, not elsewhere classified (R26.2);Other symptoms and signs involving the nervous system (R29.898);Adult, failure to thrive (R62.7)     Time: 9147-8295 PT Time Calculation (min) (ACUTE ONLY): 34 min  Charges:    $Therapeutic Activity: 23-37 mins PT General Charges $$ ACUTE PT VISIT: 1 Visit                     03/30/2023  Jacinto Halim., PT Acute Rehabilitation Services [phone]  (office)   Eliseo Gum Cay[place] Held 03/30/2023, 3:35 PM

## 2023-03-30 NOTE — ED Provider Notes (Signed)
Emergency Medicine Observation Re-evaluation Note  Nathaniel Henry is a 89 y.o. male, seen on rounds today.  Pt initially presented to the ED for complaints of Altered Mental Status Currently, the patient is resting; no new complaints   Physical Exam  BP (!) 162/73 (BP Location: Left Leg)   Pulse 71   Temp 98.1 F (36.7 C)   Resp 20   Ht 5\' 11"  (1.803 m)   Wt 79 kg   SpO2 98%   BMI 24.29 kg/m  Physical Exam General: NAD  Cardiac: regular rate  Lungs: Non labored  Psych: Calm   ED Course / MDM  EKG:   I have reviewed the labs performed to date as well as medications administered while in observation.  Recent changes in the last 24 hours include None .  Plan  Current plan is for TOC/placement.     Coral Spikes, DO 03/30/23 1243

## 2023-03-30 NOTE — ED Notes (Addendum)
PT at [facility], assisted with positioning and eating.

## 2023-03-30 NOTE — Evaluation (Incomplete)
Physical Therapy Evaluation Patient Details Name: Nathaniel Henry MRN: [id number] DOB: 02/04/1933 Today's Date: 03/30/2023  History of Present Illness  89 y.o. male who presents 12/27 with concern for failure to thrive. Patient with a PMHx of diabetes, mild dementia, chronic pain,  Clinical Impression  ***        If plan is discharge home, recommend the following: A lot of help with walking and/or transfers;A lot of help with bathing/dressing/bathroom;Assistance with cooking/housework;Direct supervision/assist for medications management;Direct supervision/assist for financial management;Assist for transportation;Supervision due to cognitive status;Help with stairs or ramp for entrance   Can travel by private vehicle   No    Equipment Recommendations None recommended by PT (TBD next venue)  Recommendations for Other Services       Functional Status Assessment Patient has had a recent decline in their functional status and demonstrates the ability to make significant improvements in function in a reasonable and predictable amount of time.     Precautions / Restrictions Precautions Precautions: Fall Restrictions Weight Bearing Restrictions Per Provider Order: No      Mobility  Bed Mobility Overal bed mobility: Needs Assistance Bed Mobility: Supine to Sit, Sit to Supine     Supine to sit: Min assist, HOB elevated Sit to supine: Mod assist   General bed mobility comments: Min assist for trunk and LEs to EOB, pt able to pull through therapist's hand to rise, demos posterior lean on EOB. Mod assist for LE support back into bed. Able to bridge and scoot up slightly with help.    Transfers Overall transfer level: Needs assistance Equipment used: None Transfers: Sit to/from Stand, Bed to chair/wheelchair/BSC Sit to Stand: Max assist, From elevated surface          Lateral/Scoot Transfers: Max assist General transfer comment: As on the eval, BIL knee block, squat scoot  along bed, partial rise, but unable to stand upright fully. Cues to shift weight anteriorly.    Ambulation/Gait               General Gait Details: unable  Stairs            Wheelchair Mobility     Tilt Bed    Modified Rankin (Stroke Patients Only)       Balance Overall balance assessment: Needs assistance Sitting-balance support: Feet supported, Single extremity supported Sitting balance-Leahy Scale: Fair Sitting balance - Comments: Progressed from min assist to CGA EOB and sat up for 20 min at EOB with/without L UE assist while eating lunch. Postural control: Posterior lean, Right lateral lean                                   Pertinent Vitals/Pain      Home Living Family/patient expects to be discharged to:: Skilled nursing facility Living Arrangements: Spouse/significant other (wife in hospitals)   Type of Home: House Home Access: Stairs to enter Entrance Stairs-Rails: None Entrance Stairs-Number of Steps: 5     Home Equipment: Wheelchair - manual;Shower seat      Prior Function Prior Level of Function : Needs assist;Patient poor historian/Family not available             Mobility Comments: W/c bound. Scoot/squat pivot for transfers ADLs Comments: States wife helps with bed mobility, son helps with shower when he visits from [place].     Extremity/Trunk Assessment  Communication   Communication Communication: Hearing impairment  Cognition Arousal: Alert Behavior During Therapy: WFL for tasks assessed/performed Overall Cognitive Status: No family/caregiver present to determine baseline cognitive functioning                                 General Comments: Pt not oriented to month/year/location. States he is in the hospital because he was falling down and getting sick. He is oriented to self and his birthdate.        General Comments General comments (skin integrity, edema, etc.): vss  on RA.    Exercises     Assessment/Plan    PT Assessment Patient needs continued PT services  PT Problem List Decreased strength;Decreased activity tolerance;Decreased balance;Decreased mobility;Decreased cognition;Decreased knowledge of use of DME       PT Treatment Interventions DME instruction;Functional mobility training;Therapeutic activities;Balance training;Therapeutic exercise;Neuromuscular re-education;Cognitive remediation;Patient/family education;Wheelchair mobility training    PT Goals (Current goals can be found in the Care Plan section)  Acute Rehab PT Goals Patient Stated Goal: none stated PT Goal Formulation: Patient unable to participate in goal setting Time For Goal Achievement: 04/10/23 Potential to Achieve Goals: Fair    Frequency Min 1X/week     Co-evaluation               AM-PAC PT "6 Clicks" Mobility  Outcome Measure Help needed turning from your back to your side while in a flat bed without using bedrails?: A Little Help needed moving from lying on your back to sitting on the side of a flat bed without using bedrails?: A Little Help needed moving to and from a bed to a chair (including a wheelchair)?: A Lot Help needed standing up from a chair using your arms (e.g., wheelchair or bedside chair)?: Total Help needed to walk in hospital room?: Total Help needed climbing 3-5 steps with a railing? : Total 6 Click Score: 11    End of Session Equipment Utilized During Treatment: Gait belt Activity Tolerance: Patient tolerated treatment well Patient left: in bed;with call bell/phone within reach Nurse Communication: Mobility status;Need for lift equipment (Spoke with NT caring for patient.) PT Visit Diagnosis: Muscle weakness (generalized) (M62.81);Difficulty in walking, not elsewhere classified (R26.2);Other symptoms and signs involving the nervous system (R29.898);Adult, failure to thrive (R62.7)    Time:  -      Charges:                  {enter signature dotphrase here}  Eliseo Gum Cleone Hulick 03/30/2023, 2:44 PM

## 2023-03-30 NOTE — ED Notes (Signed)
[facility] [address] [facility] will send someone to assess pt tomorrow for acceptance/ placement. Delay d/t changing insurance.

## 2023-03-30 NOTE — Progress Notes (Signed)
CSW spoke with Kia at [facility] who states patient's insurance is requesting an updated PT note to support insurance authorization.  CSW spoke with patient's daughter in law Tobe Sos who states patient's [employer] plan switches to [place] on 04/01/23 - #[id number]. Tobe Sos states patient has Medicare part A. Tobe Sos states she and her husband have a meeting today with staff at [facility] at 4pm to discuss patient transitioning to ALF.  Edwin Dada, MSW, LCSW Transitions of Care  Clinical Social Worker II [phone]

## 2023-03-30 NOTE — ED Notes (Addendum)
Patient heels floated with pillows, and currently on his back. Tolerated well. Bed alarm on.

## 2023-03-30 NOTE — ED Notes (Addendum)
Pt turned flat to back. Tolerated well. Pt refused having his brief remove for buttocks assessment for breakdown.

## 2023-03-30 NOTE — ED Notes (Signed)
Pt turned to left side.

## 2023-03-30 NOTE — ED Notes (Signed)
EDP in to see, at BS.  

## 2023-03-31 NOTE — ED Provider Notes (Signed)
 Emergency Medicine Observation Re-evaluation Note  Nathaniel Henry is a 89 y.o. male, seen on rounds today.  Pt initially presented to the ED for complaints of Altered Mental Status Currently, the patient is awake.  Physical Exam  BP 97/66   Pulse 71   Temp 97.7 F (36.5 C) (Oral)   Resp 20   Ht 5' 11 (1.803 m)   Wt 79 kg   SpO2 100%   BMI 24.29 kg/m  Physical Exam General: awake   ED Course / MDM  EKG:   I have reviewed the labs performed to date as well as medications administered while in observation.  Recent changes in the last 24 hours include nothing.  Plan  Current plan is for placement.    Ruthe Cornet, DO 03/31/23 1644

## 2023-03-31 NOTE — Progress Notes (Addendum)
 3:05pm: CSW sent patient's clinicals to [facility] and [facility] for review in attempt to obtain bed offers.  12:30pm: CSW spoke with Kia at [facility] who states she is having difficulties obtaining insurance authorization from patient's current [employer] plan due to the plan switching to [facility] tomorrow. Kia to speak with patient's family about possibly private paying for STR at SNF.  11:20am: CSW received call from Juanita, CHARITY [employer]DRA[employer] from [place] who states based on patient's current condition the facility will not be able to accept him as he is a 2+ assist and needs STR at SNF first. Juanita states she will speak with patient's family to inform them of information.  10:55am: CSW received call from Kia at [facility] who states patient's insurance authorization is still pending at this time.  9:15am: CSW received return call from [place] who states Juanita will be visiting patient sometime after 10am.  8:40am: CSW spoke with patient's son Prentice callander states he has been unable to locate a physical copy of patient's new insurance card [place]) that goes into effect tomorrow, 04/01/23. [employer] ID# [id number], member [id number].   CSW spoke with Chyrl at [facility] [address] who states [place], RN will be visiting patient today for an assessment. Chyrl states he will contact Juanita to determine what time and let CSW know.  Niels Portugal, MSW, LCSW Transitions of Care  Clinical Social Worker II [phone]

## 2023-03-31 NOTE — TOC Progression Note (Signed)
 Transition of Care [facility]) - Progression Note    Patient Details  Name: Nathaniel Henry MRN: [id number] Date of Birth: 1932/07/06  [facility]) CM/SW Contact  Justina Delcia Czar, RN Phone Number: [phone] 03/31/2023, 3:06 PM  Clinical Narrative:     CM spoke to pt's DIL, and states [facility] declined for ALF, states she prefers [facility] or [facility] for LTC or SNF rehab if they have to pay out of pocket. Sent message to ED CSW, Niels.        Expected Discharge Plan and Services                                               Social Determinants of Health (SDOH) Interventions SDOH Screenings   Tobacco Use: Low Risk  (03/28/2023)    Readmission Risk Interventions     No data to display

## 2023-04-01 NOTE — Progress Notes (Addendum)
 2pm: CSW spoke with Bari at [facility] who states the private pay rate for LTC is $365 a day, $10,950 monthly.  CSW spoke with patient's daughter in law who states she spoke with patient's wife Rock who states she doesn't want patient placed at [facility] but prefers [facility], [facility], or [facility] for LTC placement. CSW sent clinicals to facilities for review.  12:45pm: CSW spoke with Bari at [facility] to request she review patient's clinicals in attempts to obtain a bed offer.  Niels Portugal, MSW, LCSW Transitions of Care  Clinical Social Worker II [phone]

## 2023-04-01 NOTE — ED Provider Notes (Signed)
 Emergency Medicine Observation Re-evaluation Note  Nathaniel Henry is a 89 y.o. male, seen on rounds today.  Pt initially presented to the ED for complaints of Altered Mental Status Currently, the patient is resting   Physical Exam  BP (!) 91/58   Pulse 64   Temp 97.7 F (36.5 C) (Axillary)   Resp 16   Ht 5' 11 (1.803 m)   Wt 79 kg   SpO2 98%   BMI 24.29 kg/m  Physical Exam General: NAD Cardiac: RR Lungs: Clear  Psych: Calm  ED Course / MDM  EKG:   I have reviewed the labs performed to date as well as medications administered while in observation.  Recent changes in the last 24 hours include none.  Plan  Current plan is for Placement.     Neysa Caron PARAS, DO 04/01/23 [phone]

## 2023-04-02 LAB — CBG MONITORING, ED: Glucose-Capillary: 172 mg/dL — ABNORMAL HIGH (ref 70–99)

## 2023-04-02 NOTE — ED Notes (Addendum)
 Patient had 1 BM - soft brown. Peri care and foley care completed. Patient assisted with bed bath. Patient turned to right side with pillow support. St.2 pressure ulceration noted to coccyx; Mepilex applied to coccyx. Patient educated about frequent turning and skin care interventions.

## 2023-04-02 NOTE — ED Notes (Addendum)
 duplicate

## 2023-04-02 NOTE — TOC Progression Note (Signed)
 Transition of Care [facility]) - Progression Note    Patient Details  Name: Nathaniel Henry MRN: [id number] Date of Birth: [birth year]  Transition of Care [facility]) CM/SW Contact  Hartley KATHEE Robertson, LCSWA Phone Number: 04/02/2023, 8:50 PM  Clinical Narrative:     Paperwork to be completed tomorrow at 1:00 pm  at [facility], once completed pt can dc as long as [facility] has the AVS by 12 pm.        Expected Discharge Plan and Services                                               Social Determinants of Health (SDOH) Interventions SDOH Screenings   Tobacco Use: Low Risk  (03/28/2023)    Readmission Risk Interventions     No data to display

## 2023-04-02 NOTE — Progress Notes (Addendum)
 2:30pm: CSW spoke with Velma at [facility] who states the facility can offer the patient a bed. The daily rate for a private room is $335 ($10,050/month) and semi-private is $300 ($9,000/month).  CSW spoke with patient's daughter in law Niels to present her with information. [facility] accepted pricing and agreeable to accept bed offer.  CSW informed Velma of information and Velma states facility can accept patient tomorrow after the financial requirements have been met.   11am: CSW spoke with Burnard at [facility] who states they are unable to offer patient a bed at this time.  CSW attempted to reach admissions at [facility] without success - a text message was sent requesting a return call.  CSW spoke with Melissa at [facility] who states the facility does have one male bed in a semi private room available. Melissa states CSW needs to speak with Whitney at central intake.  CSW spoke with [place] to request she review patient - Whitney agreeable and will notify CSW with decision.  Niels Portugal, MSW, LCSW Transitions of Care  Clinical Social Worker II [phone]

## 2023-04-02 NOTE — ED Provider Notes (Signed)
 Emergency Medicine Observation Re-evaluation Note  Nathaniel Henry is a 89 y.o. male, seen on rounds today.  Pt initially presented to the ED for complaints of Altered Mental Status Family brought patient to the ED because the patient's wife who has been his primary caregiver was hospitalized.  No one was able to care for the patient at home currently, the patient is resting.  Physical Exam  BP (!) 108/94 (BP Location: Left Leg)   Pulse 66   Temp 97.6 F (36.4 C) (Oral)   Resp 16   Ht 1.803 m (5' 11)   Wt 79 kg   SpO2 99%   BMI 24.29 kg/m  Physical Exam General: nad Cardiac: Regular rate Lungs: Normal effort   ED Course / MDM  EKG:   I have reviewed the labs performed to date as well as medications administered while in observation.  Recent changes in the last 24 hours include no events.  Plan  Current plan is for placement.    Randol Simmonds, MD 04/02/23 [phone]

## 2023-04-03 ENCOUNTER — Observation Stay (HOSPITAL_COMMUNITY): Payer: Medicare Other

## 2023-04-03 DIAGNOSIS — L8992 Pressure ulcer of unspecified site, stage 2: Secondary | ICD-10-CM | POA: Insufficient documentation

## 2023-04-03 DIAGNOSIS — E785 Hyperlipidemia, unspecified: Secondary | ICD-10-CM

## 2023-04-03 DIAGNOSIS — E119 Type 2 diabetes mellitus without complications: Secondary | ICD-10-CM | POA: Diagnosis not present

## 2023-04-03 DIAGNOSIS — Z978 Presence of other specified devices: Secondary | ICD-10-CM

## 2023-04-03 DIAGNOSIS — E860 Dehydration: Secondary | ICD-10-CM | POA: Diagnosis not present

## 2023-04-03 DIAGNOSIS — D649 Anemia, unspecified: Secondary | ICD-10-CM | POA: Diagnosis present

## 2023-04-03 DIAGNOSIS — R579 Shock, unspecified: Secondary | ICD-10-CM | POA: Diagnosis present

## 2023-04-03 DIAGNOSIS — D509 Iron deficiency anemia, unspecified: Secondary | ICD-10-CM | POA: Diagnosis present

## 2023-04-03 DIAGNOSIS — R195 Other fecal abnormalities: Secondary | ICD-10-CM | POA: Diagnosis present

## 2023-04-03 DIAGNOSIS — R823 Hemoglobinuria: Secondary | ICD-10-CM

## 2023-04-03 DIAGNOSIS — N179 Acute kidney failure, unspecified: Secondary | ICD-10-CM

## 2023-04-03 LAB — URINALYSIS, ROUTINE W REFLEX MICROSCOPIC
Bilirubin Urine: NEGATIVE
Glucose, UA: NEGATIVE mg/dL
Ketones, ur: NEGATIVE mg/dL
Nitrite: NEGATIVE
Protein, ur: NEGATIVE mg/dL
Specific Gravity, Urine: 1.004 — ABNORMAL LOW (ref 1.005–1.030)
WBC, UA: >50 WBC/hpf (ref 0–5)
pH: 5 (ref 5.0–8.0)

## 2023-04-03 LAB — RETIC PANEL
Immature Retic Fract: 13.3 % (ref 2.3–15.9)
RBC.: 3.27 MIL/uL — ABNORMAL LOW (ref 4.22–5.81)
Retic Count, Absolute: 38.6 10*3/uL (ref 19.0–186.0)
Retic Ct Pct: 1.2 % (ref 0.4–3.1)
Reticulocyte Hemoglobin: 29.8 pg (ref >27.9)

## 2023-04-03 LAB — URINALYSIS, W/ REFLEX TO CULTURE (INFECTION SUSPECTED)
Bilirubin Urine: NEGATIVE
Glucose, UA: NEGATIVE mg/dL
Ketones, ur: NEGATIVE mg/dL
Nitrite: NEGATIVE
Protein, ur: NEGATIVE mg/dL
Specific Gravity, Urine: 1.006 (ref 1.005–1.030)
pH: 5 (ref 5.0–8.0)

## 2023-04-03 LAB — CBC WITH DIFFERENTIAL/PLATELET
Abs Immature Granulocytes: 0.01 10*3/uL (ref 0.00–0.07)
Abs Immature Granulocytes: 0.03 10*3/uL (ref 0.00–0.07)
Basophils Absolute: 0 10*3/uL (ref 0.0–0.1)
Basophils Absolute: 0.1 10*3/uL (ref 0.0–0.1)
Basophils Relative: 0 %
Basophils Relative: 0 %
Eosinophils Absolute: 0.2 10*3/uL (ref 0.0–0.5)
Eosinophils Absolute: 0.1 10*3/uL (ref 0.0–0.5)
Eosinophils Relative: 3 %
Eosinophils Relative: 1 %
HCT: 46.8 % (ref 39.0–52.0)
HCT: 28.9 % — ABNORMAL LOW (ref 39.0–52.0)
Hemoglobin: 15.5 g/dL (ref 13.0–17.0)
Hemoglobin: 9.5 g/dL — ABNORMAL LOW (ref 13.0–17.0)
Immature Granulocytes: 0 %
Immature Granulocytes: 0 %
Lymphocytes Relative: 16 %
Lymphocytes Relative: 6 %
Lymphs Abs: 1 10*3/uL (ref 0.7–4.0)
Lymphs Abs: 0.7 10*3/uL (ref 0.7–4.0)
MCH: 28.5 pg (ref 26.0–34.0)
MCH: 29.2 pg (ref 26.0–34.0)
MCHC: 33.1 g/dL (ref 30.0–36.0)
MCHC: 32.9 g/dL (ref 30.0–36.0)
MCV: 86.2 fL (ref 80.0–100.0)
MCV: 88.9 fL (ref 80.0–100.0)
Monocytes Absolute: 0.4 10*3/uL (ref 0.1–1.0)
Monocytes Absolute: 0.4 10*3/uL (ref 0.1–1.0)
Monocytes Relative: 6 %
Monocytes Relative: 4 %
Neutro Abs: 4.5 10*3/uL (ref 1.7–7.7)
Neutro Abs: 10.1 10*3/uL — ABNORMAL HIGH (ref 1.7–7.7)
Neutrophils Relative %: 75 %
Neutrophils Relative %: 89 %
Platelets: 166 10*3/uL (ref 150–400)
Platelets: 243 10*3/uL (ref 150–400)
RBC: 5.43 MIL/uL (ref 4.22–5.81)
RBC: 3.25 MIL/uL — ABNORMAL LOW (ref 4.22–5.81)
RDW: 15.5 % (ref 11.5–15.5)
RDW: 15.2 % (ref 11.5–15.5)
WBC: 6 10*3/uL (ref 4.0–10.5)
WBC: 11.4 10*3/uL — ABNORMAL HIGH (ref 4.0–10.5)
nRBC: 0 % (ref 0.0–0.2)
nRBC: 0 % (ref 0.0–0.2)

## 2023-04-03 LAB — BASIC METABOLIC PANEL
Anion gap: 11 (ref 5–15)
Anion gap: 11 (ref 5–15)
BUN: 51 mg/dL — ABNORMAL HIGH (ref 8–23)
BUN: 49 mg/dL — ABNORMAL HIGH (ref 8–23)
CO2: 19 mmol/L — ABNORMAL LOW (ref 22–32)
CO2: 19 mmol/L — ABNORMAL LOW (ref 22–32)
Calcium: 8.8 mg/dL — ABNORMAL LOW (ref 8.9–10.3)
Calcium: 8.8 mg/dL — ABNORMAL LOW (ref 8.9–10.3)
Chloride: 101 mmol/L (ref 98–111)
Chloride: 100 mmol/L (ref 98–111)
Creatinine, Ser: 1.37 mg/dL — ABNORMAL HIGH (ref 0.61–1.24)
Creatinine, Ser: 1.34 mg/dL — ABNORMAL HIGH (ref 0.61–1.24)
GFR, Estimated: 50 mL/min — ABNORMAL LOW (ref >60)
GFR, Estimated: 51 mL/min — ABNORMAL LOW (ref >60)
Glucose, Bld: 129 mg/dL — ABNORMAL HIGH (ref 70–99)
Glucose, Bld: 104 mg/dL — ABNORMAL HIGH (ref 70–99)
Potassium: 4.3 mmol/L (ref 3.5–5.1)
Potassium: 4.8 mmol/L (ref 3.5–5.1)
Sodium: 131 mmol/L — ABNORMAL LOW (ref 135–145)
Sodium: 130 mmol/L — ABNORMAL LOW (ref 135–145)

## 2023-04-03 LAB — CK: Total CK: 141 U/L (ref 49–397)

## 2023-04-03 LAB — CBG MONITORING, ED: Glucose-Capillary: 100 mg/dL — ABNORMAL HIGH (ref 70–99)

## 2023-04-03 LAB — I-STAT CG4 LACTIC ACID, ED: Lactic Acid, Venous: 2 mmol/L (ref 0.5–1.9)

## 2023-04-03 MED ORDER — SODIUM CHLORIDE 0.9% FLUSH
3.0000 mL | Freq: Two times a day (BID) | INTRAVENOUS | Status: DC
  Administered 2023-04-04 (×2): 10 mL via INTRAVENOUS

## 2023-04-03 MED ORDER — LACTATED RINGERS IV BOLUS
1000.0000 mL | Freq: Once | INTRAVENOUS | Status: AC
Start: 2023-04-03 — End: 2023-04-03
  Administered 2023-04-03: 1000 mL via INTRAVENOUS

## 2023-04-03 MED ORDER — LACTATED RINGERS IV BOLUS
500.0000 mL | Freq: Once | INTRAVENOUS | Status: AC
Start: 2023-04-03 — End: 2023-04-03
  Administered 2023-04-03: 500 mL via INTRAVENOUS

## 2023-04-03 MED ORDER — ENOXAPARIN SODIUM 40 MG/0.4ML IJ SOSY
40.0000 mg | PREFILLED_SYRINGE | INTRAMUSCULAR | Status: DC
  Administered 2023-04-03 – 2023-04-05 (×3): 40 mg via SUBCUTANEOUS
  Filled 2023-04-03 (×3): qty 0.4

## 2023-04-03 MED ORDER — HYDROXYZINE HCL 25 MG PO TABS: 25.0000 mg | ORAL_TABLET | Freq: Three times a day (TID) | ORAL | Status: DC | PRN

## 2023-04-03 MED ORDER — MELATONIN 3 MG PO TABS
3.0000 mg | ORAL_TABLET | Freq: Every evening | ORAL | Status: DC | PRN
  Administered 2023-04-03 – 2023-04-05 (×3): 3 mg via ORAL
  Filled 2023-04-03 (×4): qty 1

## 2023-04-03 MED ORDER — LOPERAMIDE HCL 2 MG PO CAPS
2.0000 mg | ORAL_CAPSULE | Freq: Once | ORAL | Status: AC
  Administered 2023-04-03: 2 mg via ORAL
  Filled 2023-04-03: qty 1

## 2023-04-03 MED ORDER — SODIUM CHLORIDE 0.9% FLUSH: 3.0000 mL | INTRAVENOUS | Status: DC | PRN

## 2023-04-03 MED ORDER — LISINOPRIL 10 MG PO TABS: 5.0000 mg | ORAL_TABLET | Freq: Every day | ORAL | Status: DC

## 2023-04-03 MED ORDER — NOREPINEPHRINE 4 MG/250ML-% IV SOLN
0.0000 ug/min | INTRAVENOUS | Status: DC
  Administered 2023-04-03: 2 ug/min via INTRAVENOUS
  Filled 2023-04-03: qty 250

## 2023-04-03 MED ORDER — SODIUM CHLORIDE 0.9% FLUSH
3.0000 mL | Freq: Two times a day (BID) | INTRAVENOUS | Status: DC
  Administered 2023-04-04 (×2): 3 mL via INTRAVENOUS

## 2023-04-03 MED ORDER — MIDODRINE HCL 5 MG PO TABS
10.0000 mg | ORAL_TABLET | Freq: Three times a day (TID) | ORAL | Status: DC
  Administered 2023-04-03 – 2023-04-04 (×2): 10 mg via ORAL
  Filled 2023-04-03 (×2): qty 2

## 2023-04-03 MED ORDER — LACTATED RINGERS IV SOLN
Freq: Once | INTRAVENOUS | Status: AC
Start: 2023-04-03 — End: 2023-04-03

## 2023-04-03 MED ORDER — LACTATED RINGERS IV SOLN: INTRAVENOUS | Status: AC

## 2023-04-03 MED ORDER — LACTATED RINGERS IV BOLUS
1000.0000 mL | Freq: Once | INTRAVENOUS | Status: AC
  Administered 2023-04-03: 1000 mL via INTRAVENOUS

## 2023-04-03 MED ORDER — LACTATED RINGERS IV BOLUS
500.0000 mL | Freq: Once | INTRAVENOUS | Status: AC
  Administered 2023-04-03: 500 mL via INTRAVENOUS

## 2023-04-03 NOTE — ED Notes (Signed)
Breakfast at bedside.

## 2023-04-03 NOTE — ED Notes (Signed)
 Family member at bedside, they stated that his foley catheter was changed at home by home health nurse 3 weeks ago, patient denies this RN to change his foley. Attempted to reach out to internist, awaiting for them to get back to me. Will let next shift nurse know.

## 2023-04-03 NOTE — ED Provider Notes (Signed)
 Notified by nursing at 0005 that patient had a few loose stools over the last few hours. Eating/drinking normally. No fever. No abdominal pain. One dose of imodium  ordered.   Notified by nursing at 0210 that patient had an isolated episode of hypotension without any other physiciologic changes. Bp was 85/63, HR 90. Improved to 90's systolic without intervention. On review of records he has had a couple isolated low BP's over the last few days that seem to have improved without intervention, however with the diarrhea and bedbound status, ordered a liter of fluids. Will recheck labs to ensure no significant changes requiring admission.   Mild bump in BUN/Cr, BP stabilized. No complaints.   UOP increased to approximately 100/hr from 250/7 hours previously. Suspect mild hypovolemia. Will give another liter as still having soft pressures. Hold Lisinopril  for today. Plan for recheck later today.    [place] Karl, Selinda, MD 04/03/23 [phone]

## 2023-04-03 NOTE — Progress Notes (Addendum)
 CSW spoke with MD regarding patient - MD states patient is receiving fluids as he was hypotensive overnight. MD states patient will likely be medically cleared for discharge tomorrow.  CSW informed Velma [phone]) at [facility] of information. Velma states the facility can accept patient tomorrow as long as the [facility] is sent today as the pharmacy is not open on weekends. CSW sent MAR via secure email to [email or web address] for review.  Niels Portugal, MSW, LCSW Transitions of Care  Clinical Social Worker II [phone]

## 2023-04-03 NOTE — ED Notes (Signed)
 Patient assisted with turning self to left side; pillow support to back and buttocks; heels off loaded with pillow. Brief remains clean and dry.
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
## 2023-04-03 NOTE — ED Notes (Signed)
 BP 80s/50s - patient alert/oriented x 4 - Hr 70s-80s; afebrile. Dr. Lorette notified of BP; orders received.   Patient assisted with turning self to right side - pillow support provided to back and buttocks. Heels offloaded with pillow. Brief remains clean and dry.

## 2023-04-03 NOTE — Hospital Course (Addendum)
 89 year old male with impaired mobility, bedbound for the last year or so, has been boarding in the emergency department for the last 6 days awaiting placement as family can no longer take care of at home.  Overnight he became hypotensive and we are admitting him for undifferentiated shock.  Hypovolemic Shock Patient was hypotensive with MAP as low as 52, uncertain etiology.  During initial evaluation, he was not tachycardic febrile.  We did an initial infectious workup which did show findings consistent with a UTI on his urinalysis.  Chest x-ray did not show any findings suspicious for pneumonia.  Blood cultures remained no grow,  urine culture grew KLEBSIELLA PNEUMONIAE. I suspect that he has become hypovolemic over several days in the emergency department due to poor oral intake and starting HTN medications that patient was not complaint with at home.   Elevated BUN/creatinine and hemoconcentration that reverted to baseline after fluid resuscitation support this.  Appreciated input from critical care, they saw him and recommended MAP goal of 55 and boosting blood pressure with midodrine  for now.  Patient received IV fluids, midodrine , and we held off on home blood pressure medications.  Midodrine  was discontinued.  Blood pressure improved with these interventions. Hypovolemic shock resolved.   AKI Patient is AKI has improved with IVF. AKI is most likely due to hypovolemia.  Patient serum creatinine was 1.33 on day of discharge.  During hospitalization, we held lisinopril  5 mg.  Loose stool  Possibly due to restarting metformin  500 mg, per dispense history, patient was not feeling metformin .  Per family report, it appears that wife struggled with cognition therefore unsure if patient was actually receiving metformin .  Loose stool is most likely from initiation of metformin  during his stay boarded in the ED.   Type 2 diabetes On a home regimen of Metformin  and glipizide .  An A1c was collected which was  7.9.  Patient's home regimen was held upon admission and not continued on discharge.   UTI Chronic indwelling foley cathter  BPH Per patient's family, indwelling Foley catheter was placed 1 year ago while patient was hospitalized for pneumonia.  It sounds like the catheter was placed for acute urinary retention and possibly from BPH.  Patient has never followed up with urology outpatient.  It was reported that the Foley catheter was changed to 3 weeks ago by a home health RN.  Patient was started on ceftriaxone .  Foley catheter was originally replaced and removed for void trial which was successful, he was discharged without the foley catheter. Urine culture grew KLEBSIELLA PNEUMONIAE and he was transitioned from ceftriaxone  to cefadroxil  for a total ABX therapy of 7 days .  Anemia Hemoglobin 10.1 on initial presentation to the ED, after several days it increased to 15.5, suspect from hemoconcentration.  After fluid resuscitation it is down to 9.5.  Baseline wavers, but hemoglobin around 9 does not appear unusual for him based on review of prior labs. Hemoglobin remained stable throughout hospitalization. Iron panel showed a saturation ratio of 9, ferritin was decreased at 12, B12 was 306, and B9 was 17.1. Recommend outpatient evaluation of risk vs benefit of IV iron.   H/o Oropharyngeal Dysphagia  Aspiration precautions placed   Acquired Hypothyroidism Patient is on a home regimen of Synthroid  75 mcg, TSH was checked which was 3.290 and patient was continued on his home regimen of Synthroid  75 mcg.  Sacral decubitus ulcer Covered with foam, exposed for exam, looks okay.  Do not think this is a source of  infection.  Wound care evaluated and recommended cleanse sacral wound with Vashe daily, apply hydrogel for moisture, top with dressing.   Failure to thrive Patient was brought to the emergency department via family for failure to thrive. Family is no longer able to provide care for him. He was  boarded in the ED for 6 days awaiting placement to SNF. During these 6 days, his home medication regimen was started. Per family, the wife was administering the medications; however, the wife was struggling to care for herself as well and it was believed that he was not receiving these medications. Dispense history confirms that the patient was not receiving his home medications regularly. After 6 days, he became hypotensive which was the reason for admission to the hospital with IMTS.

## 2023-04-03 NOTE — ED Provider Notes (Signed)
 Patient again became hypotensive.  He has received a total of over 3 L of fluid.  He did appear dry, repeat echocardiogram which showed normal EF, relatively normal IVC.  He is asymptomatic.  No fever or focal signs of infection, urinalysis is pending.  Given persistent hypotension, now mildly elevated lactic acid I did order Levophed .  Critical care was consulted.  I updated family, patient is DNR/DNI but they are okay with him being managed medically and being admitted.  Critical care evaluated, they recommend stopping levo, starting midodrine  if needed and giving additional IV fluids.  They also recommend decreasing MAP goal to 55 and admitting to hospitalist.  I spoke with the internal medicine service, patient to be admitted for further management.   Nicholaus Cassondra DEL, MD 04/03/23 [phone]

## 2023-04-03 NOTE — ED Notes (Signed)
 Dr. Allena Katz at bedside

## 2023-04-03 NOTE — ED Notes (Signed)
 Patient assisted with turning self to right side - pillow support placed on back and buttocks - heels offloaded with pillow. Brief remains clean and dry.   UOP improved to around 163ml/hr after pt finished IV fluid bolus.  However on vitals assessment @0640 : BP 81/45. Patient remains aox4. Dr. Lorette made aware.

## 2023-04-03 NOTE — ED Provider Notes (Signed)
 Emergency Medicine Observation Re-evaluation Note  Nathaniel Henry is a 89 y.o. male, seen on rounds today.  Pt initially presented to the ED for complaints of Altered Mental Status Currently, the patient is awaiting placement.  Physical Exam  BP 102/77   Pulse 66   Temp 98.2 F (36.8 C) (Oral)   Resp 18   Ht 5' 11 (1.803 m)   Wt 79 kg   SpO2 93%   BMI 24.29 kg/m  Physical Exam General: Calm, resting comfortably Cardiac: Regular rate Lungs: Clear lungs, normal work of breathing Psych: Calm, cooperative  ED Course / MDM  EKG:   I have reviewed the labs performed to date as well as medications administered while in observation.  Recent changes in the last 24 hours include 2 L of fluid administered.  Patient had episode of hypotension overnight.  Evaluated by overnight doctor, repeat labs were repeated notable for hemoglobin of 15 compared to 10 on initial presentation as well as mild AKI and mild non-anion gap metabolic acidosis.  Patient has received 2 L of fluid.  My evaluation his blood pressure has improved to 102/77.  He is nontachycardic.  He is awake and alert and asked why he woke him up.  He has no complaints, no dizziness or chest pain or shortness of breath.  Discussed with his nurse at bedside, who states he improved well with IV fluids.  On exam he appears dry with dry mucous membranes.  Bedside ultrasound shows normal EF, slightly diminished IVC consistent with likely dehydration.  He has no fever here or signs of sepsis.  Will run some maintenance fluids today and continue to reevaluate throughout the day.  If he becomes hypotensive again may need admission.  Plan  Current plan is for likely placement.    Nicholaus Cassondra DEL, MD 04/03/23 [phone]

## 2023-04-03 NOTE — ED Notes (Signed)
Dr. Clayborne Dana at bedside

## 2023-04-03 NOTE — ED Notes (Signed)
 Spoke to
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
## 2023-04-03 NOTE — ED Notes (Signed)
 Patient 1 incontinent BM - loose brown, diarrhea. Dr. Clayborne Dana made contacted and made aware. Peri care and foley care completed. Patient assisted with turning self to left side - pillow support placed.

## 2023-04-04 DIAGNOSIS — Z79890 Hormone replacement therapy (postmenopausal): Secondary | ICD-10-CM | POA: Diagnosis not present

## 2023-04-04 DIAGNOSIS — I952 Hypotension due to drugs: Secondary | ICD-10-CM

## 2023-04-04 DIAGNOSIS — Z79899 Other long term (current) drug therapy: Secondary | ICD-10-CM | POA: Diagnosis not present

## 2023-04-04 DIAGNOSIS — N390 Urinary tract infection, site not specified: Secondary | ICD-10-CM | POA: Diagnosis present

## 2023-04-04 DIAGNOSIS — R627 Adult failure to thrive: Principal | ICD-10-CM

## 2023-04-04 DIAGNOSIS — L89153 Pressure ulcer of sacral region, stage 3: Secondary | ICD-10-CM | POA: Diagnosis not present

## 2023-04-04 DIAGNOSIS — T83511A Infection and inflammatory reaction due to indwelling urethral catheter, initial encounter: Secondary | ICD-10-CM | POA: Diagnosis not present

## 2023-04-04 DIAGNOSIS — B961 Klebsiella pneumoniae [K. pneumoniae] as the cause of diseases classified elsewhere: Secondary | ICD-10-CM | POA: Diagnosis not present

## 2023-04-04 DIAGNOSIS — F03A0 Unspecified dementia, mild, without behavioral disturbance, psychotic disturbance, mood disturbance, and anxiety: Secondary | ICD-10-CM | POA: Diagnosis not present

## 2023-04-04 DIAGNOSIS — Z85828 Personal history of other malignant neoplasm of skin: Secondary | ICD-10-CM | POA: Diagnosis not present

## 2023-04-04 DIAGNOSIS — R1312 Dysphagia, oropharyngeal phase: Secondary | ICD-10-CM | POA: Diagnosis present

## 2023-04-04 DIAGNOSIS — E872 Acidosis, unspecified: Secondary | ICD-10-CM | POA: Diagnosis not present

## 2023-04-04 DIAGNOSIS — E861 Hypovolemia: Secondary | ICD-10-CM | POA: Diagnosis present

## 2023-04-04 DIAGNOSIS — N179 Acute kidney failure, unspecified: Secondary | ICD-10-CM | POA: Diagnosis not present

## 2023-04-04 DIAGNOSIS — Z96641 Presence of right artificial hip joint: Secondary | ICD-10-CM | POA: Diagnosis present

## 2023-04-04 DIAGNOSIS — E860 Dehydration: Secondary | ICD-10-CM | POA: Diagnosis present

## 2023-04-04 DIAGNOSIS — R571 Hypovolemic shock: Secondary | ICD-10-CM | POA: Diagnosis not present

## 2023-04-04 DIAGNOSIS — N3000 Acute cystitis without hematuria: Secondary | ICD-10-CM | POA: Diagnosis not present

## 2023-04-04 DIAGNOSIS — Y846 Urinary catheterization as the cause of abnormal reaction of the patient, or of later complication, without mention of misadventure at the time of the procedure: Secondary | ICD-10-CM | POA: Diagnosis present

## 2023-04-04 DIAGNOSIS — R579 Shock, unspecified: Secondary | ICD-10-CM | POA: Diagnosis present

## 2023-04-04 DIAGNOSIS — I5032 Chronic diastolic (congestive) heart failure: Secondary | ICD-10-CM | POA: Diagnosis present

## 2023-04-04 DIAGNOSIS — Z7984 Long term (current) use of oral hypoglycemic drugs: Secondary | ICD-10-CM | POA: Diagnosis not present

## 2023-04-04 DIAGNOSIS — E7800 Pure hypercholesterolemia, unspecified: Secondary | ICD-10-CM | POA: Diagnosis present

## 2023-04-04 DIAGNOSIS — E039 Hypothyroidism, unspecified: Secondary | ICD-10-CM | POA: Diagnosis present

## 2023-04-04 DIAGNOSIS — E119 Type 2 diabetes mellitus without complications: Secondary | ICD-10-CM | POA: Diagnosis not present

## 2023-04-04 DIAGNOSIS — D509 Iron deficiency anemia, unspecified: Secondary | ICD-10-CM | POA: Diagnosis not present

## 2023-04-04 DIAGNOSIS — I110 Hypertensive heart disease with heart failure: Secondary | ICD-10-CM | POA: Diagnosis not present

## 2023-04-04 DIAGNOSIS — Z66 Do not resuscitate: Secondary | ICD-10-CM | POA: Diagnosis not present

## 2023-04-04 LAB — COMPREHENSIVE METABOLIC PANEL
ALT: 12 U/L (ref 0–44)
AST: 17 U/L (ref 15–41)
Albumin: 2.6 g/dL — ABNORMAL LOW (ref 3.5–5.0)
Alkaline Phosphatase: 73 U/L (ref 38–126)
Anion gap: 10 (ref 5–15)
BUN: 44 mg/dL — ABNORMAL HIGH (ref 8–23)
CO2: 20 mmol/L — ABNORMAL LOW (ref 22–32)
Calcium: 8.7 mg/dL — ABNORMAL LOW (ref 8.9–10.3)
Chloride: 104 mmol/L (ref 98–111)
Creatinine, Ser: 1.25 mg/dL — ABNORMAL HIGH (ref 0.61–1.24)
GFR, Estimated: 55 mL/min — ABNORMAL LOW (ref >60)
Glucose, Bld: 63 mg/dL — ABNORMAL LOW (ref 70–99)
Potassium: 4.9 mmol/L (ref 3.5–5.1)
Sodium: 134 mmol/L — ABNORMAL LOW (ref 135–145)
Total Bilirubin: 0.4 mg/dL (ref 0.0–1.2)
Total Protein: 5.2 g/dL — ABNORMAL LOW (ref 6.5–8.1)

## 2023-04-04 LAB — CBC
HCT: 25.2 % — ABNORMAL LOW (ref 39.0–52.0)
Hemoglobin: 8.3 g/dL — ABNORMAL LOW (ref 13.0–17.0)
MCH: 28.9 pg (ref 26.0–34.0)
MCHC: 32.9 g/dL (ref 30.0–36.0)
MCV: 87.8 fL (ref 80.0–100.0)
Platelets: 230 10*3/uL (ref 150–400)
RBC: 2.87 MIL/uL — ABNORMAL LOW (ref 4.22–5.81)
RDW: 15.3 % (ref 11.5–15.5)
WBC: 7.6 10*3/uL (ref 4.0–10.5)
nRBC: 0 % (ref 0.0–0.2)

## 2023-04-04 LAB — IRON AND TIBC
Iron: 23 ug/dL — ABNORMAL LOW (ref 45–182)
Saturation Ratios: 9 % — ABNORMAL LOW (ref 17.9–39.5)
TIBC: 265 ug/dL (ref 250–450)
UIBC: 242 ug/dL

## 2023-04-04 LAB — CBG MONITORING, ED
Glucose-Capillary: 71 mg/dL (ref 70–99)
Glucose-Capillary: 70 mg/dL (ref 70–99)

## 2023-04-04 LAB — FERRITIN: Ferritin: 12 ng/mL — ABNORMAL LOW (ref 24–336)

## 2023-04-04 LAB — LACTIC ACID, PLASMA: Lactic Acid, Venous: 1.4 mmol/L (ref 0.5–1.9)

## 2023-04-04 LAB — TSH: TSH: 3.29 u[IU]/mL (ref 0.350–4.500)

## 2023-04-04 LAB — FOLATE: Folate: 17.1 ng/mL (ref >5.9)

## 2023-04-04 LAB — VITAMIN B12: Vitamin B-12: 306 pg/mL (ref 180–914)

## 2023-04-04 MED ORDER — SODIUM CHLORIDE 0.9 % IV SOLN
1.0000 g | INTRAVENOUS | Status: DC
Start: 2023-04-04 — End: 2023-04-06
  Administered 2023-04-04 – 2023-04-06 (×3): 1 g via INTRAVENOUS
  Filled 2023-04-04 (×4): qty 10

## 2023-04-04 MED ORDER — MIDODRINE HCL 5 MG PO TABS
10.0000 mg | ORAL_TABLET | Freq: Three times a day (TID) | ORAL | Status: DC
  Administered 2023-04-04 – 2023-04-05 (×2): 10 mg via ORAL
  Filled 2023-04-04 (×2): qty 2

## 2023-04-04 NOTE — Progress Notes (Signed)
 Physical Therapy Treatment Patient Details Name: Nathaniel Henry MRN: [id number] DOB: Apr 26, 1932 Today's Date: 04/04/2023   History of Present Illness 89 y.o. male who presents 12/27 with concern for failure to thrive. Hospital course complicated by pt becoming hypotensive and was admitted for undifferentiated shock. Patient with a PMHx of diabetes, mild dementia, chronic pain,    PT Comments  The pt continues to demonstrate deficits in gross strength, balance, activity tolerance, and cognition. At this time, he is requiring mod-maxA for bed mobility and maxA to come to a half stand with bil UE support and bil knees blocked. He denied lightheadedness throughout the session, see BP measures below. Noted sacral skin integrity concerns, of which RN is aware. Coordinated with RN on continuing to roll pt every x2 hours and get air mattress as needed. Will continue to follow acutely.  BP- 121/69 supine 117/57 sitting EOB    If plan is discharge home, recommend the following: A lot of help with bathing/dressing/bathroom;Assistance with cooking/housework;Direct supervision/assist for medications management;Direct supervision/assist for financial management;Assist for transportation;Supervision due to cognitive status;Help with stairs or ramp for entrance;Two people to help with walking and/or transfers   Can travel by private vehicle     No  Equipment Recommendations  None recommended by PT (TBD next venue)    Recommendations for Other Services       Precautions / Restrictions Precautions Precautions: Fall Restrictions Weight Bearing Restrictions Per Provider Order: No     Mobility  Bed Mobility Overal bed mobility: Needs Assistance Bed Mobility: Supine to Sit, Sit to Supine, Rolling Rolling: Mod assist, Used rails   Supine to sit: Mod assist, HOB elevated, Used rails Sit to supine: Max assist, HOB elevated   General bed mobility comments: Pt needing step-by-step cues and assistance  to bring each leg off L EOB and ascend trunk with pt pulling up on therapist, modA. MaxA to lift legs back onto bed and pivot pt to align trunk midline in bed. ModA to roll either direction with pt initiating grabbing rails to pull    Transfers Overall transfer level: Needs assistance Equipment used: 1 person hand held assist Transfers: Sit to/from Stand Sit to Stand: Max assist, From elevated surface           General transfer comment: EOB elevated, bil knees blocked, and cued pt to hold onto therapist anterior to him to pull up to stand. Bed pad utilized to extend pt's hips to stand. MaxA to come to a half stand    Ambulation/Gait               General Gait Details: unable   Stairs             Wheelchair Mobility     Tilt Bed    Modified Rankin (Stroke Patients Only)       Balance Overall balance assessment: Needs assistance Sitting-balance support: Bilateral upper extremity supported, Feet supported Sitting balance-Leahy Scale: Poor Sitting balance - Comments: Initially needing modA due to posterior lean this progressed to CGA with intermittent minA Postural control: Posterior lean     Standing balance comment: MaxA and bil UE support with bil knees blocked to come to a half stand                            Cognition Arousal: Alert Behavior During Therapy: [facility] for tasks assessed/performed Overall Cognitive Status: No family/caregiver present to determine baseline cognitive functioning  General Comments: Pt A/O to self and being in hospital when provided list to choose from. Slow to process cues and initiate mobility. Needs multi-modal cues and extra time for all tasks.        Exercises General Exercises - Lower Extremity Long Arc Quad: AROM, Strengthening, Both, 10 reps, Seated    General Comments General comments (skin integrity, edema, etc.): BP 121/69 supine, 117/57 sitting EOB, pt  denied lightheadedness throughout      Pertinent Vitals/Pain Pain Assessment Pain Assessment: Faces Faces Pain Scale: Hurts even more Pain Location: legs with mobility Pain Descriptors / Indicators: Discomfort, Grimacing, Guarding, Moaning Pain Intervention(s): Limited activity within patient's tolerance, Monitored during session, Repositioned    Home Living Family/patient expects to be discharged to:: Skilled nursing facility Living Arrangements: Spouse/significant other                 Additional Comments: return to SNF, Pt's family unable to assist    Prior Function            PT Goals (current goals can now be found in the care plan section) Acute Rehab PT Goals Patient Stated Goal: none stated PT Goal Formulation: With patient Time For Goal Achievement: 04/10/23 Potential to Achieve Goals: Fair Progress towards PT goals: Progressing toward goals    Frequency    Min 1X/week      PT Plan      Co-evaluation              AM-PAC PT 6 Clicks Mobility   Outcome Measure  Help needed turning from your back to your side while in a flat bed without using bedrails?: A Lot Help needed moving from lying on your back to sitting on the side of a flat bed without using bedrails?: A Lot Help needed moving to and from a bed to a chair (including a wheelchair)?: A Lot Help needed standing up from a chair using your arms (e.g., wheelchair or bedside chair)?: A Lot Help needed to walk in hospital room?: Total Help needed climbing 3-5 steps with a railing? : Total 6 Click Score: 10    End of Session Equipment Utilized During Treatment: Gait belt Activity Tolerance: Patient tolerated treatment well Patient left: in bed;with call bell/phone within reach;with bed alarm set;Other (comment) (rolled to R) Nurse Communication: Mobility status;Other (comment) (air mattress needs?; keep pt on rolling schedule) PT Visit Diagnosis: Muscle weakness (generalized)  (M62.81);Difficulty in walking, not elsewhere classified (R26.2);Other symptoms and signs involving the nervous system (R29.898);Adult, failure to thrive (R62.7);Unsteadiness on feet (R26.81)     Time: 8452-8394 PT Time Calculation (min) (ACUTE ONLY): 18 min  Charges:    $Therapeutic Activity: 8-22 mins PT General Charges $$ ACUTE PT VISIT: 1 Visit                     Theo Ferretti, PT, DPT Acute Rehabilitation Services  Office: [phone]    Theo CHRISTELLA Ferretti 04/04/2023, 4:20 PM

## 2023-04-04 NOTE — Evaluation (Signed)
 Occupational Therapy Evaluation Patient Details Name: Nathaniel Henry MRN: [id number] DOB: 02/09/1933 Today's Date: 04/04/2023   History of Present Illness 89 y.o. male who presents for AMS. Patient with a PMHx of diabetes, mild dementia, chronic pain,   Clinical Impression   Pt c/o no pain at rest, overall weakness and fatigue. Pt eating lunch upon arrival. Pt A/Ox2, poor historian, states PLOF able to transfer in/out of w/c with set up and lateral scoot, states he hasn't been able to complete by himself in several months. Pt mod/max for bed mobility, currently unable to sit EOB without OT support, R lateral and posterior lean. Pt not able to complete ADLs without mod-max A, mostly at bed level. Pt states he has not stood in several months, per PT eval 5 days ago Pt max A for stand pivot transfer to w/c.  Pt would benefit from continued acute OT to maximize progress as able, return to postacute rehab <3hrs/day appropriate.       If plan is discharge home, recommend the following: Two people to help with walking and/or transfers;A lot of help with bathing/dressing/bathroom;Assist for transportation;Help with stairs or ramp for entrance;Assistance with cooking/housework    Functional Status Assessment  Patient has had a recent decline in their functional status and demonstrates the ability to make significant improvements in function in a reasonable and predictable amount of time.  Equipment Recommendations  None recommended by OT    Recommendations for Other Services       Precautions / Restrictions Precautions Precautions: Fall Restrictions Weight Bearing Restrictions Per Provider Order: No      Mobility Bed Mobility Overal bed mobility: Needs Assistance Bed Mobility: Supine to Sit, Sit to Supine     Supine to sit: Mod assist, HOB elevated, Used rails Sit to supine: Max assist   General bed mobility comments: mod-max A for in/out of bed, poor BLE strength and trunk  control.    Transfers Overall transfer level: Needs assistance                 General transfer comment: Pt not able to stand at this time      Balance Overall balance assessment: Needs assistance Sitting-balance support: Bilateral upper extremity supported, Feet supported Sitting balance-Leahy Scale: Poor Sitting balance - Comments: not able to balance sitting EOB, posterior and R lateral lean Postural control: Right lateral lean, Posterior lean     Standing balance comment: unable                           ADL either performed or assessed with clinical judgement   ADL Overall ADL's : Needs assistance/impaired Eating/Feeding: Set up;Bed level   Grooming: Minimal assistance;Sitting   Upper Body Bathing: Moderate assistance;Bed level   Lower Body Bathing: Maximal assistance;Bed level   Upper Body Dressing : Moderate assistance;Sitting   Lower Body Dressing: Maximal assistance;Bed level       Toileting- Clothing Manipulation and Hygiene: Maximal assistance;Bed level         General ADL Comments: Pt mod-max for most ADLs, unable to sit and balance on EOB, posterior and R sided lean, poor trunk control, weakness. Pt not able to stand or perform lateral transfers     Vision Baseline Vision/History: 0 No visual deficits Ability to See in Adequate Light: 0 Adequate Patient Visual Report: No change from baseline       Perception         Praxis  Pertinent Vitals/Pain Pain Assessment Pain Assessment: No/denies pain     Extremity/Trunk Assessment Upper Extremity Assessment Upper Extremity Assessment: Generalized weakness           Communication Communication Communication: Hearing impairment   Cognition Arousal: Alert Behavior During Therapy: WFL for tasks assessed/performed Overall Cognitive Status: No family/caregiver present to determine baseline cognitive functioning                                 General  Comments: Pt A/O to locaiton and self only.     General Comments       Exercises     Shoulder Instructions      Home Living Family/patient expects to be discharged to:: Skilled nursing facility                                 Additional Comments: return to SNF, Pt's family unable to assist      Prior Functioning/Environment Prior Level of Function : Needs assist;Patient poor historian/Family not available             Mobility Comments: W/c bound. Scoot/squat pivot for transfers ADLs Comments: States wife helps with bed mobility, son helps with shower when he visits from [place] .        OT Problem List: Decreased strength;Decreased range of motion;Decreased activity tolerance;Impaired balance (sitting and/or standing);Decreased cognition      OT Treatment/Interventions: Self-care/ADL training;Therapeutic exercise;Energy conservation;DME and/or AE instruction;Therapeutic activities;Patient/family education    OT Goals(Current goals can be found in the care plan section) Acute Rehab OT Goals Patient Stated Goal: to improve strnegth OT Goal Formulation: With patient Time For Goal Achievement: 04/18/23 Potential to Achieve Goals: Fair  OT Frequency: Min 1X/week    Co-evaluation              AM-PAC OT 6 Clicks Daily Activity     Outcome Measure Help from another person eating meals?: A Little Help from another person taking care of personal grooming?: A Little Help from another person toileting, which includes using toliet, bedpan, or urinal?: A Lot Help from another person bathing (including washing, rinsing, drying)?: A Lot Help from another person to put on and taking off regular upper body clothing?: A Lot Help from another person to put on and taking off regular lower body clothing?: A Lot 6 Click Score: 14   End of Session Nurse Communication: Mobility status  Activity Tolerance: Patient tolerated treatment well Patient left: in bed;with  call bell/phone within reach;with bed alarm set  OT Visit Diagnosis: Unsteadiness on feet (R26.81);Other abnormalities of gait and mobility (R26.89);Muscle weakness (generalized) (M62.81);Other symptoms and signs involving cognitive function                Time: 1400-1431 OT Time Calculation (min): 31 min Charges:  OT General Charges $OT Visit: 1 Visit OT Evaluation $OT Eval Moderate Complexity: 1 Mod OT Treatments $Self Care/Home Management : 8-22 mins  [place], OTR/L   Elouise JONELLE Bott 04/04/2023, 2:38 PM

## 2023-04-04 NOTE — Progress Notes (Signed)
 PVR ordered. Patient incontinent of urine 2x. Bladder scanned for . External catheter applied.
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
## 2023-04-04 NOTE — ED Notes (Signed)
 Patient eating breakfast tray independently at this time.

## 2023-04-04 NOTE — Care Management (Signed)
 Attempted to give son Tawni Carnes notice via phone, left confidential message to return call

## 2023-04-04 NOTE — Progress Notes (Signed)
 Medication list provided by family: Atorvastatin  40 mg daily Bisacodyl  10 mg suppository  Docusate 100 mg BID Glipizide  10 mg daily Gabapentin  300 mg 3 times daily Lisinopril  5 mg daily Levothyroxine  75 mcg daily Metformin  500mg  BID  Miralax  prn Omeprazole 40 mg Tamsulosin  0.4 mg daily Vitamin D3

## 2023-04-04 NOTE — Progress Notes (Signed)
 Subjective:  Nathaniel Henry is a 89 y.o.  old male with impaired mobility, bedbound for the last year or so, has been boarding in the emergency department for the last 6 days awaiting placement as family can no longer take care of at home. He became hypotensive and we are admitting him for undifferentiated shock.   Today, the patient denied any complaints. He denied dizziness this morning.  Patient reports coughing while eating and drinking and that this is chronic.  He reported that he does not have family or a wife today.   I spoke on the phone with patient's son and daughter in law. Patient was living at home with his wife until his wife was hospitalized.  Patient's son and daughter-in-law are unable to provide the care for Mr. Vogelsang and brought him into the emergency department for failure to thrive for placement.  Patient's family was able to provide with the medications over the phone which I will list below; however, is unsure whether or not the patient was actually receiving his medications.  Per patient's family, the wife he was imaged during the patient's medications a degree of dementia and we are unsure whether or not he was actually receiving them.  The Foley catheter was placed in January during a hospitalization for pneumonia due to acute urinary retention.  Patient has not seen a urologist since that hospitalization.  Per patient's wife, the catheter is changed about 2 to 3 weeks ago.  Patient's family is comfortable with him going to a SNF, he has a bed ready for placement on Monday at [facility].   Objective:  Vital signs in last 24 hours: Vitals:   04/04/23 0630 04/04/23 0800 04/04/23 0911 04/04/23 1002  BP: 110/70 (!) 106/52  (!) 108/54  Pulse: 63 72  76  Resp: 16 17  17   Temp: 98 F (36.7 C)  98.3 F (36.8 C)   TempSrc: Oral  Oral   SpO2: 100% 98%  97%  Weight:      Height:       Physical Exam: General:Elderly male sitting in bed  Cardiac:RRR, no murmur  appreciated  Pulmonary: Normal effort on room air Abdominal: Soft, nontender, no guarding Neuro: Awake, participating conversation MSK: No pitting edema appreciated on exam Skin: Warm and dry Psych: Normal mood and affect     Latest Ref Rng & Units 04/04/2023    2:15 AM 04/03/2023    3:49 PM 04/03/2023    2:30 AM  CBC  WBC 4.0 - 10.5 K/uL 7.6  11.4  6.0   Hemoglobin 13.0 - 17.0 g/dL 8.3  9.5  84.4   Hematocrit 39.0 - 52.0 % 25.2  28.9  46.8   Platelets 150 - 400 K/uL 230  243  166        Latest Ref Rng & Units 04/04/2023    2:15 AM 04/03/2023    3:49 PM 04/03/2023    2:30 AM  CMP  Glucose 70 - 99 mg/dL 63  895  870   BUN 8 - 23 mg/dL 44  49  51   Creatinine 0.61 - 1.24 mg/dL 8.74  8.65  8.62   Sodium 135 - 145 mmol/L 134  130  131   Potassium 3.5 - 5.1 mmol/L 4.9  4.8  4.3   Chloride 98 - 111 mmol/L 104  100  101   CO2 22 - 32 mmol/L 20  19  19    Calcium  8.9 - 10.3 mg/dL 8.7  8.8  8.8   Total Protein 6.5 - 8.1 g/dL 5.2     Total Bilirubin 0.0 - 1.2 mg/dL 0.4     Alkaline Phos 38 - 126 U/L 73     AST 15 - 41 U/L 17     ALT 0 - 44 U/L 12      Lactic Acid, Venous    Component Value Date/Time   LATICACIDVEN 1.4 04/04/2023 0215   UA: Leukocytes: Large, Bacteria: Rare, WBC clumps present   CXR 04/03/2023: Slight linear opacity at the bases, likely scar or atelectasis.   Assessment/Plan:  Principal Problem:   Shock (HCC) Active Problems:   Loose stools   Diabetes mellitus, type 2 (HCC)   Foley catheter in place on admission   Anemia   Decubitus ulcer limited to breakdown of skin (stage 2) (HCC)   AKI (acute kidney injury) (HCC)   Hemoglobinuria  Hypovolemic shock Patient shock is resolving after receiving IV fluids.  Patient remains asymptomatic with baseline mentation.  Initial infectious workup of shock did reveal possible UTI, I do not believe that this is what was causing his shock: he remained afebrile and leukocytosis improved prior to initiation of antibiotics.  His  shock was most likely due to hypovolemia especially with the improvement in lactic acid and blood pressure after beginning IV fluids.  Will continue to encourage oral intake for rehydration. Plan: -Continue to encourage oral intake -Will discontinue midodrine  -Will continue to hold lisinopril   -Will continue to follow blood and urine culture results   UTI Chronic indwelling foley cathter  BPH Per patient's family, indwelling Foley catheter was placed 1 year ago while patient was hospitalized for pneumonia.  It sounds like the catheter was placed for acute urinary retention and possibly from BPH.  Patient has never followed up with urology outpatient.  It was reported that the Foley catheter was changed to 3 weeks ago by a home health RN.  Urinalysis is consistent with UTI, will treat and await culture results. Plan: -Will continue ceftriaxone  until culture and susceptibilities are back, antibiotics for a total of 7 days if culture is positive -Remove Foley catheter today, will attempt void trial -Will follow-up on postvoid residual bladder scans 4 hours after Foley catheter is removed. -Follow up urine cultures   AKI Patient is AKI has improved with IVF.  Serum creatinine is at 1.25 this morning.  AKI is most likely due to hypovolemia. Plan: -BMP tomorrow morning -Encourage p.o. intake -Continue to hold off on lisinopril  5 mg  Anemia Appears at patient's baseline hemoglobin is around 9.  Hemoglobin this morning is 8.3.  I do not believe that he is actively bleeding at this moment. Plan: -CBC in morning -Follow-up on iron panel and ferritin labs -Will order B12 and B9  Loose stool  Possibly due to restarting metformin  500 mg, per dispense history, patient was not feeling metformin .  Per family report, it appears that wife struggled with cognition therefore unsure if patient was actually receiving metformin .  Plan: -Continue to hold off on restarting metformin   Type 2 diabetes On a  home regimen of Metformin  and glipizide .  Will hold home regimen. Plan: -A1c ordered to help guide diabetes medication regimen -Morning CBG monitoring  Sacral decubitus ulcer, POA Covered with foam, exposed for exam,does not appear infected. Do not think this is a source of shock.  -Will ask wound ostomy continence to evaluate.  H/o Oropharyngeal Dysphagia  Aspiration precautions placed  Acquired Hypothyroidism Patient is on a home regimen  of Synthroid  75 mcg, will recheck a TSH while hospitalized.   Resolved Problems:   __________________________________ Prior to Admission Living Arrangement:Home Anticipated Discharge Location:SNF Barriers to Discharge:Hypotension, placement  Dispo: Anticipated discharge in approximately 2 day(s).   Kandis Perkins, DO 04/04/2023, 11:05 AM Pager: [phone] After 5pm on weekdays and 1pm on weekends: On Call pager [phone]

## 2023-04-04 NOTE — ED Notes (Signed)
 Foley removed per order, new brief and bed pad applied as well as new mepilex dressing

## 2023-04-05 DIAGNOSIS — N390 Urinary tract infection, site not specified: Secondary | ICD-10-CM

## 2023-04-05 LAB — GLUCOSE, CAPILLARY: Glucose-Capillary: 100 mg/dL — ABNORMAL HIGH (ref 70–99)

## 2023-04-05 NOTE — Progress Notes (Addendum)
 Interval history Midodrine  added for hypotension overnight.  Patient reports feeling much better today.  No new concerns.  The Foley catheter was discontinued and he is urinating into a collection system, about 400 mL of pale urine in suction canister.  He does not have any suprapubic pain.  Nurse reports 100 mL of urine on bladder scan overnight.  Physical exam Blood pressure (!) 107/57, pulse (!) 54, temperature 98 F (36.7 C), temperature source Oral, resp. rate 18, height 5' 6 (1.676 m), weight 78.4 kg, SpO2 98%.  No distress, frail-appearing Heart rate and rhythm are normal, radial pulses strong Breathing comfortably on room air Skin is warm and dry No abdominal tenderness or suprapubic tenderness to palpation or percussion Alert, speech is normal sounding, appropriately answers questions and follows simple instructions, no facial   Assessment and plan Hospital day 8  Nathaniel Henry is a 89 y.o. male with impaired mobility, bedbound for the last year or so, admitted for shock after boarding in the emergency department awaiting SNF placement.  Doing better with supportive care and treatment for UTI, stable for discharge to SNF, hopefully tomorrow.  Principal Problem:   UTI (urinary tract infection) Urine culture from clean-catch after removal of chronic Foley catheter showed 100,000 CFU's of gram-negative rods.  On ceftriaxone  now, can tailor antibiotic therapy to susceptibilities when those return.  Active Problems:   Diabetes mellitus, type 2 (HCC) A1c in 2019 6.6.  Was on metformin  outside of the hospital.  This drug has been discontinued.  CBGs ranging from 70-170 during this hospitalization without insulin .    Iron deficiency anemia No acute blood loss.  Given advanced age, dementia, and bedbound status I think risks of colonoscopy outweigh the benefits.  May consider iron supplementation but p.o. comes with risks of constipation in this elderly  bedbound person.  I am opting to defer for now.    Decubitus ulcer limited to breakdown of skin (stage 2) (HCC) Present on admission.  Wound ostomy continence consulted, appreciate their help.    Hemoglobinuria Microscopic, probably related to prolonged Foley catheterization, may be also concomitant UTI.  Can repeat UA if in line with goals of care in outpatient setting.    Failure to thrive in adult Unfortunate situation for this person, unable to care for themselves.  Dependent for all ADLs.  Wife is primary caretaker, but her health status is poor and she is now unable to take care of him.  Pending SNF placement, hopeful for tomorrow.    Hypotension due to drugs After prolonged stay in emergency department awaiting placement, compounded by inadequate oral intake.  Doing better after IV fluid resuscitation and discontinuation of antihypertensives and tamsulosin .  Blood pressures are improved, map goal of >55 is still appropriate for him.  Will discontinue midodrine .    Retention of urine, unspecified Suspect BPH.  He has been Foley dependent for a long time without urology follow-up.  We remove the Foley yesterday and he is voiding well, thus he will discharge without Foley catheter in place.  Holding tamsulosin  for now due to hypotension.  Resolved Problems:   Shock (HCC)   Loose stools   Foley catheter in place on admission   AKI (acute kidney injury) (HCC)  Diet: Carb modified IVF: N/A VTE: enoxaparin  (LOVENOX ) injection 40 mg Start: 04/03/23 1630  Code: DNR PT/OT recommendations: SNF for subacute rehab TOC recommendations: Apparently family was amenable to  private pay at [place] prior to this patient's admission for hypotension, will follow-up to see if he can go tomorrow. Family Update: N/A  Discharge plan: Medically ready for discharge, pending disposition plan  Nathaniel Henry 04/05/2023, 9:51 AM  Pager: [phone] After 5pm or weekend: [phone]

## 2023-04-05 NOTE — Consult Note (Signed)
 WOC Nurse Consult Note: Reason for Consult:sacral wound and arm abrasion. Groin wound Wound type: Full thickness wound arm; abrasion; scabbed  Stage 3 Pressure Injury: POA; pale, epibole of wound edges Will follow up with staff about groin, no documentation to review Pressure Injury POA: Yes Measurement: see nursing flow sheets  Wound bed:see above Drainage (amount, consistency, odor) none Periwound:intact  Dressing procedure/placement/frequency Cleanse sacral wound with Vashe daily, apply hydrogel for moisture, top with dressing.  Manage arm wound with skin care order set, silicone foam appropriate    Re consult if needed, will not follow at this time. Thanks  Gionna Polak [employer], RN,CWOCN, CNS, CWON-AP [phone])

## 2023-04-05 NOTE — Plan of Care (Signed)
  Problem: Clinical Measurements: Goal: Will remain free from infection Outcome: Progressing Goal: Cardiovascular complication will be avoided Outcome: Progressing   Problem: Activity: Goal: Risk for activity intolerance will decrease Outcome: Progressing   Problem: Nutrition: Goal: Adequate nutrition will be maintained Outcome: Progressing   Problem: Coping: Goal: Level of anxiety will decrease Outcome: Progressing   Problem: Elimination: Goal: Will not experience complications related to urinary retention Outcome: Progressing   Problem: Safety: Goal: Ability to remain free from injury will improve Outcome: Progressing   Problem: Skin Integrity: Goal: Risk for impaired skin integrity will decrease Outcome: Progressing

## 2023-04-06 DIAGNOSIS — N3000 Acute cystitis without hematuria: Secondary | ICD-10-CM

## 2023-04-06 LAB — BASIC METABOLIC PANEL
Anion gap: 10 (ref 5–15)
BUN: 43 mg/dL — ABNORMAL HIGH (ref 8–23)
CO2: 23 mmol/L (ref 22–32)
Calcium: 8.6 mg/dL — ABNORMAL LOW (ref 8.9–10.3)
Chloride: 103 mmol/L (ref 98–111)
Creatinine, Ser: 1.49 mg/dL — ABNORMAL HIGH (ref 0.61–1.24)
GFR, Estimated: 45 mL/min — ABNORMAL LOW (ref >60)
Glucose, Bld: 133 mg/dL — ABNORMAL HIGH (ref 70–99)
Potassium: 4.3 mmol/L (ref 3.5–5.1)
Sodium: 136 mmol/L (ref 135–145)

## 2023-04-06 LAB — GLUCOSE, CAPILLARY: Glucose-Capillary: 116 mg/dL — ABNORMAL HIGH (ref 70–99)

## 2023-04-06 LAB — CBC
HCT: 27.6 % — ABNORMAL LOW (ref 39.0–52.0)
Hemoglobin: 9.1 g/dL — ABNORMAL LOW (ref 13.0–17.0)
MCH: 28.7 pg (ref 26.0–34.0)
MCHC: 33 g/dL (ref 30.0–36.0)
MCV: 87.1 fL (ref 80.0–100.0)
Platelets: 262 10*3/uL (ref 150–400)
RBC: 3.17 MIL/uL — ABNORMAL LOW (ref 4.22–5.81)
RDW: 15.2 % (ref 11.5–15.5)
WBC: 8.1 10*3/uL (ref 4.0–10.5)
nRBC: 0 % (ref 0.0–0.2)

## 2023-04-06 LAB — URINE CULTURE: Culture: >=100000 — AB

## 2023-04-06 LAB — MAGNESIUM: Magnesium: 1.3 mg/dL — ABNORMAL LOW (ref 1.7–2.4)

## 2023-04-06 LAB — HEMOGLOBIN A1C
Hgb A1c MFr Bld: 7.9 % — ABNORMAL HIGH (ref 4.8–5.6)
Mean Plasma Glucose: 180 mg/dL

## 2023-04-06 MED ORDER — CEFADROXIL 500 MG PO CAPS
500.0000 mg | ORAL_CAPSULE | Freq: Two times a day (BID) | ORAL | Status: DC
Start: 2023-04-07 — End: 2023-04-11
  Administered 2023-04-07: 500 mg via ORAL
  Filled 2023-04-06 (×2): qty 1

## 2023-04-06 MED ORDER — MAGNESIUM SULFATE 2 GM/50ML IV SOLN
2.0000 g | Freq: Once | INTRAVENOUS | Status: AC
  Administered 2023-04-06: 2 g via INTRAVENOUS
  Filled 2023-04-06: qty 50

## 2023-04-06 MED ORDER — RIVAROXABAN 10 MG PO TABS
10.0000 mg | ORAL_TABLET | Freq: Every day | ORAL | Status: DC
  Administered 2023-04-06: 10 mg via ORAL
  Filled 2023-04-06: qty 1

## 2023-04-06 NOTE — TOC Progression Note (Addendum)
 Transition of Care [facility] - Progression Note    Patient Details  Name: Nathaniel Henry MRN: [id number] Date of Birth: 09/23/1932  [facility]) CM/SW Contact  Luise JAYSON Pan, [place] Phone Number: 04/06/2023, 12:31 PM  Clinical Narrative:    Plan for pt to DC tomorrow to [facility] SN[facility]. CSW spoke with pts dtr in law [phone]) to provide her with udpate. DIL states pt will need ambulance transport at dc.   TOC will continue to follow.     Expected Discharge Plan: Skilled Nursing Facility Barriers to Discharge: Other (must enter comment) (Pt needs 3 nights inpatient stay per Medicare rule (pt is on night 2 at this this time; will be ready tomorrow 1/7))  Expected Discharge Plan and Services In-house Referral: Clinical Social Work     Living arrangements for the past 2 months:  (Unable to assess)                                       Social Determinants of Health (SDOH) Interventions SDOH Screenings   Food Insecurity: No Food Insecurity (04/04/2023)  Housing: Low Risk  (04/04/2023)  Transportation Needs: No Transportation Needs (04/04/2023)  Utilities: Not At Risk (04/04/2023)  Social Connections: Socially Isolated (04/04/2023)  Tobacco Use: Low Risk  (03/28/2023)    Readmission Risk Interventions     No data to display

## 2023-04-06 NOTE — Progress Notes (Addendum)
 Subjective:  Nathaniel Henry is a 89 y.o.  old male with impaired mobility, bedbound for the last year or so, has been boarding in the emergency department for the last 6 days awaiting placement as family can no longer take care of at home. He became hypotensive and we are admitting him for undifferentiated shock.   Today, he is doing ok. He is urinating well. He denies acute complaints.   Objective:  Vital signs in last 24 hours: Vitals:   04/05/23 2355 04/06/23 0416 04/06/23 0709 04/06/23 1028  BP: (!) 135/59 119/60 113/64 116/73  Pulse: 60 60 62 (!) 58  Resp: 17 15 18 20   Temp: 97.8 F (36.6 C) 98.1 F (36.7 C) 97.8 F (36.6 C) 97.8 F (36.6 C)  TempSrc: Oral Oral Oral Oral  SpO2: 96% 96% 96% 98%  Weight:  78.4 kg    Height:       Physical Exam: General:NAD, elderly appearing male  Cardiac:RRR, no murmur appreciated  Pulmonary:normal effort on RA Abdominal:soft, non-tender, no bladder distension appreciated  Neuro:alert, awake, participating in conversation  Skin:warm and dry  Psych:  normal mood and affect      Latest Ref Rng & Units 04/06/2023    2:42 AM 04/04/2023    2:15 AM 04/03/2023    3:49 PM  CBC  WBC 4.0 - 10.5 K/uL 8.1  7.6  11.4   Hemoglobin 13.0 - 17.0 g/dL 9.1  8.3  9.5   Hematocrit 39.0 - 52.0 % 27.6  25.2  28.9   Platelets 150 - 400 K/uL 262  230  243        Latest Ref Rng & Units 04/06/2023    2:42 AM 04/04/2023    2:15 AM 04/03/2023    3:49 PM  CMP  Glucose 70 - 99 mg/dL 866  63  895   BUN 8 - 23 mg/dL 43  44  49   Creatinine 0.61 - 1.24 mg/dL 8.50  8.74  8.65   Sodium 135 - 145 mmol/L 136  134  130   Potassium 3.5 - 5.1 mmol/L 4.3  4.9  4.8   Chloride 98 - 111 mmol/L 103  104  100   CO2 22 - 32 mmol/L 23  20  19    Calcium  8.9 - 10.3 mg/dL 8.6  8.7  8.8   Total Protein 6.5 - 8.1 g/dL  5.2    Total Bilirubin 0.0 - 1.2 mg/dL  0.4    Alkaline Phos 38 - 126 U/L  73    AST 15 - 41 U/L  17    ALT 0 - 44 U/L  12     Lactic Acid, Venous     Component Value Date/Time   LATICACIDVEN 1.4 04/04/2023 0215   UA: Leukocytes: Large, Bacteria: Rare, WBC clumps present  Urine Culture: Klebsiella pneumonia   Assessment/Plan:  Principal Problem:   UTI (urinary tract infection) Active Problems:   Diabetes mellitus, type 2 (HCC)   Iron deficiency anemia   Decubitus ulcer limited to breakdown of skin (stage 2) (HCC)   Hemoglobinuria   Failure to thrive in adult   Hypotension due to drugs   Retention of urine, unspecified  UTI Chronic indwelling foley cathter, resolved  BPH Urine culture grew Klebsiella pneumonia .  Per patient's family, indwelling Foley catheter was placed 1 year ago while patient was hospitalized for pneumonia.  It sounds like the catheter was placed for acute urinary retention and possibly from BPH.  Patient  has never followed up with urology outpatient.  It was reported that the Foley catheter was changed to 3 weeks ago by a home health RN.   Plan: -Transition to cefadroxil  tomorrow, on day 3/7 of antibiotic treatment  -Continue to monitor urine output  -Medically ready for discharge, waiting for placement   AKI Serum cr increased to 1.49 today. He has good urine output and does not have evidence of retention on bladder scan.  Plan: -BMP tomorrow morning -Encourage p.o. intake -Continue to hold off on lisinopril  5 mg  Hypotension due to medications Patient remains stable and normotensive. Will discontinue HTN regimen on d/c Plan: -Continue to encourage oral intake -Will continue to hold lisinopril    Type 2 diabetes On a home regimen of Metformin  and glipizide .  Will hold home regimen. Plan: -A1c is 7.9, risk of mediations are greater than benefit at this point  -Morning CBG monitoring  Sacral decubitus ulcer, Stage 3, POA Covered with foam, exposed for exam,does not appear infected.  -WOC recs noted.  H/o Oropharyngeal Dysphagia  Aspiration precautions placed  Acquired  Hypothyroidism Patient is on a home regimen of Synthroid  75 mcg, TSH is 3.290. Continue home regimen   Anemia Appears at patient's baseline hemoglobin is around 9.  Hemoglobin this morning is 9.1.   Stable  Resolved Problems:  Hypovolemic shock Loose stool  __________________________________ Prior to Admission Living Arrangement:Home Anticipated Discharge Location:SNF Barriers to Discharge:Placement  Dispo: Anticipated discharge in approximately 1 day(s).   Kandis Perkins, DO 04/06/2023, 12:06 PM Pager: [phone] After 5pm on weekdays and 1pm on weekends: On Call pager [phone]

## 2023-04-06 NOTE — Plan of Care (Signed)
  Problem: Health Behavior/Discharge Planning: Goal: Ability to manage health-related needs will improve Outcome: Not Progressing   Problem: Nutrition: Goal: Adequate nutrition will be maintained Outcome: Progressing   Problem: Pain Management: Goal: General experience of comfort will improve Outcome: Progressing   Problem: Safety: Goal: Ability to remain free from injury will improve Outcome: Progressing

## 2023-04-07 DIAGNOSIS — R571 Hypovolemic shock: Secondary | ICD-10-CM

## 2023-04-07 LAB — BASIC METABOLIC PANEL
Anion gap: 10 (ref 5–15)
BUN: 36 mg/dL — ABNORMAL HIGH (ref 8–23)
CO2: 24 mmol/L (ref 22–32)
Calcium: 8.8 mg/dL — ABNORMAL LOW (ref 8.9–10.3)
Chloride: 102 mmol/L (ref 98–111)
Creatinine, Ser: 1.33 mg/dL — ABNORMAL HIGH (ref 0.61–1.24)
GFR, Estimated: 51 mL/min — ABNORMAL LOW (ref >60)
Glucose, Bld: 149 mg/dL — ABNORMAL HIGH (ref 70–99)
Potassium: 4.3 mmol/L (ref 3.5–5.1)
Sodium: 136 mmol/L (ref 135–145)

## 2023-04-07 LAB — GLUCOSE, CAPILLARY
Glucose-Capillary: 134 mg/dL — ABNORMAL HIGH (ref 70–99)
Glucose-Capillary: 148 mg/dL — ABNORMAL HIGH (ref 70–99)

## 2023-04-07 MED ORDER — ACETAMINOPHEN 325 MG PO TABS: 650.0000 mg | ORAL_TABLET | Freq: Four times a day (QID) | ORAL | 0 refills | Status: AC | PRN

## 2023-04-07 MED ORDER — PANTOPRAZOLE SODIUM 40 MG PO TBEC: 80.0000 mg | DELAYED_RELEASE_TABLET | Freq: Every day | ORAL | Status: AC

## 2023-04-07 MED ORDER — MELATONIN 3 MG PO TABS: 3.0000 mg | ORAL_TABLET | Freq: Every evening | ORAL | 0 refills | Status: AC | PRN

## 2023-04-07 MED ORDER — CEFADROXIL 500 MG PO CAPS: 500.0000 mg | ORAL_CAPSULE | Freq: Two times a day (BID) | ORAL | 0 refills | Status: DC

## 2023-04-07 NOTE — Progress Notes (Signed)
 Pt being discharged, VSS, IV removed, PTAR to transport, Report called.    Balinda Quails, RN 04/07/2023 12:21 PM

## 2023-04-07 NOTE — Discharge Instructions (Signed)
 You came to the hospital for failure to thrive and you were diagnosed with UTI.  We treated you with IV fluids and antibiotics    *For your UTI  -We have started you on these following medications:  -Cefadroxil  500 mg twice a day, you will take one tablet every 12 hours until you finish the antibiotics, your first dose will be this evening.  -Please see PCP in 1-2 weeks   We have continued these medications:  -atorvastatin  (LIPITOR) 10 MG tablet -Cholecalciferol (VITAMIN D3) 2000 units TABS -levothyroxine  (SYNTHROID , LEVOTHROID) 75 MCG tablet -Multiple Vitamin (MULTIVITAMIN WITH MINERALS) TABS tablet   Please note that we have stopped the following medications:  -bisacodyl  (DULCOLAX) 10 MG suppository -docusate sodium  (COLACE) 100 MG capsule -Gabapentin  (NEURONTIN ) 300 MG capsule -glipiZIDE  (GLUCOTROL ) 10 MG tablet -HYDROcodone -acetaminophen  (NORCO/VICODIN) 5-325 MG tablet -lisinopril  (PRINIVIL ,ZESTRIL ) 5 MG tablet -metFORMIN  (GLUCOPHAGE ) 500 MG tablet -methocarbamol  (ROBAXIN ) 500 MG tablet -omeprazole (PRILOSEC) 40 MG capsule -ondansetron  (ZOFRAN ) 4 MG tablet -polyethylene glycol (MIRALAX  / GLYCOLAX ) packet -rivaroxaban  (XARELTO ) 10 MG TABS tablet -tamsulosin  (FLOMAX ) 0.4 MG CAPS capsule    Follow-up appointments: Please visit your family doctor in 7 to 10 days  If you have any questions or concerns please feel free to call: Internal medicine clinic at [phone]   If you have any of these following symptoms, please call us  or seek care at an emergency department: -Chest Pain -Difficulty Breathing -Syncope (passing out) -Drooping of face -Slurred speech -Sudden weakness in your leg or arm -Fever -Chills   We are glad that you are feeling better, it was a pleasure to care for you!  Damien Lease DO

## 2023-04-07 NOTE — Care Management Important Message (Signed)
 Important Message  Patient Details  Name: Nathaniel Henry MRN: [id number] Date of Birth: 03-Feb-1933   Important Message Given:  Yes - Medicare IM     Renie Ora 04/07/2023, 10:37 AM

## 2023-04-07 NOTE — TOC Transition Note (Signed)
 Transition of Care [facility]) - Discharge Note   Patient Details  Name: Nathaniel Henry MRN: [id number] Date of Birth: Apr 04, 1932  Transition of Care [facility]) CM/SW Contact:  Luise JAYSON Pan, LCSWA Phone Number: 04/07/2023, 10:54 AM   Clinical Narrative:   Patient will DC to: [facility] Anticipated DC date: 04/07/2023 Family notified: Daughter in Development Worker, Community by: [place]   Per MD patient ready for DC to [facility]. RN to call report prior to discharge [phone]; room 39B on [facility] ). RN, patient, patient's family, and facility notified of DC. Discharge Summary and FL2 sent to facility. DC packet on chart. Ambulance transport requested for patient.   CSW will sign off for now as social work intervention is no longer needed. Please consult us  again if new needs arise.      Final next level of care: Skilled Nursing Facility Barriers to Discharge: Barriers Resolved   Patient Goals and CMS Choice Patient states their goals for this hospitalization and ongoing recovery are:: Unable to assess          Discharge Placement              Patient chooses bed at: [facility] Patient to be transferred to facility by: [facility] Name of family member notified: Daughter in law Patient and family notified of of transfer: 04/07/23  Discharge Plan and Services Additional resources added to the After Visit Summary for   In-house Referral: Clinical Social Work                                   Social Drivers of Health (SDOH) Interventions SDOH Screenings   Food Insecurity: No Food Insecurity (04/04/2023)  Housing: Low Risk  (04/04/2023)  Transportation Needs: No Transportation Needs (04/04/2023)  Utilities: Not At Risk (04/04/2023)  Social Connections: Socially Isolated (04/04/2023)  Tobacco Use: Low Risk  (03/28/2023)     Readmission Risk Interventions     No data to display

## 2023-04-07 NOTE — Discharge Summary (Signed)
 Name: Nathaniel Henry MRN: [id number] DOB: [birth year] 89 y.o. PCP: Hartwell Area, PA-C  Date of Admission: 03/27/2023  7:33 PM Date of Discharge: 04/07/2023 10:30 AM Attending Physician: Dr. CHARLENA Eastern  Discharge Diagnosis: Principal Problem:   UTI (urinary tract infection) Active Problems:   Diabetes mellitus, type 2 (HCC)   Iron deficiency anemia   Decubitus ulcer limited to breakdown of skin (stage 2) (HCC)   Hemoglobinuria   Failure to thrive in adult   Hypotension due to drugs   Retention of urine, unspecified    Discharge Medications: Allergies as of 04/07/2023   No Known Allergies      Medication List     STOP taking these medications    bisacodyl  10 MG suppository Commonly known as: DULCOLAX   docusate sodium  100 MG capsule Commonly known as: COLACE   gabapentin  300 MG capsule Commonly known as: NEURONTIN    glipiZIDE  10 MG tablet Commonly known as: GLUCOTROL    HYDROcodone -acetaminophen  5-325 MG tablet Commonly known as: NORCO/VICODIN   lisinopril  5 MG tablet Commonly known as: ZESTRIL    metFORMIN  500 MG tablet Commonly known as: GLUCOPHAGE    methocarbamol  500 MG tablet Commonly known as: ROBAXIN    omeprazole 40 MG capsule Commonly known as: PRILOSEC   ondansetron  4 MG tablet Commonly known as: ZOFRAN    polyethylene glycol 17 g packet Commonly known as: MIRALAX  / GLYCOLAX    rivaroxaban  10 MG Tabs tablet Commonly known as: XARELTO    tamsulosin  0.4 MG Caps capsule Commonly known as: FLOMAX        TAKE these medications    acetaminophen  325 MG tablet Commonly known as: TYLENOL  Take 2 tablets (650 mg total) by mouth every 6 (six) hours as needed for mild pain (pain score 1-3), moderate pain (pain score 4-6), fever or headache.   atorvastatin  10 MG tablet Commonly known as: LIPITOR Take 10 mg by mouth every evening.   cefadroxil  500 MG capsule Commonly known as: DURICEF Take 1 capsule (500 mg total) by mouth 2 (two) times  daily.   levothyroxine  75 MCG tablet Commonly known as: SYNTHROID  Take 75 mcg by mouth daily before breakfast.   melatonin 3 MG Tabs tablet Take 1 tablet (3 mg total) by mouth at bedtime as needed.   multivitamin with minerals Tabs tablet Take 1 tablet by mouth every other day. Alternates between a multivitamin and vitamin d3 supplement   pantoprazole  40 MG tablet Commonly known as: PROTONIX  Take 2 tablets (80 mg total) by mouth daily. What changed:  how much to take when to take this   Vitamin D3 50 MCG (2000 UT) Tabs Take 2,000 Units by mouth every other day. Alternates between a multivitamin and vitamin d3 supplement               Discharge Care Instructions  (From admission, onward)           Start     Ordered   04/07/23 0000  Discharge wound care:       Comments: See above,   Pressure Injury 04/04/23 Sacrum Mid Stage 2 -  Partial thickness loss of dermis presenting as a shallow open injury with a red, pink wound bed without slough. red, open, non-painful  Wound care  Daily      Comments: Cleanse sacral wound with Vashe Soila # [phone]), pat dry. Apply hydrogel (Lawson # [id number]) to gauze and place in wound bed, top with dry dressing. Change daily  Cleanse sacral wound with Vashe daily, apply hydrogel for moisture, top  with dressing.  Manage arm wound with skin care order set, silicone foam appropriate   04/07/23 1030            Disposition and follow-up:   Mr.Langford A Wellen was discharged from [facility] in Stable condition.  At the hospital follow up visit please address:  1.  Follow-up:  *UTI -Ensure patient finishes antibiotic course of Cefadroxil  500 mg BID (will finish on 04/10/2023)  *AKI -Repeat BMP -Encourage oral intake  *Anemia, Iron Deficiency  -Repeat CBC -Consider IV iron replacement, he would be a poor candidate for oral iron due to constipation in an elderly patient with impaired mobility  -Continue Protonix   40 mg BID, consider decreasing to 40 mg daily   *BPH, Chronic Indwelling Foley Catheter -Ensure patient is urinating -Assess risk vs benefit of starting Flomax  -Consider urology follow up   *T2DM -Note that patient was on glipizide  on home and that glipizide  was discontinued -A1c was 7.9, medication risks out weight the benefit in this patient   *Sacral decubitus ulcer -Cleanse sacral wound with Vashe daily, apply hydrogel for moisture, top with dressing.  -Manage arm wound with skin care order set, silicone foam appropriate  *H/o HTN *Hypovolemic Shock -Home medications were held and discontinued -Encourage oral intake   *H/o Oropharyngeal Dysphagia  -Place Aspiration precautions   *Loose Stools -Note that we stopped patient's bowel regimen due to loose stools from starting metformin  -Assess risk vs benefits of restarting bowel regimen   2.  Labs / imaging needed at time of follow-up: BMP, CBC  3.  Pending labs/ test needing follow-up: N/A  4.  Medication Changes  STOPPED  -bisacodyl  (DULCOLAX) 10 MG suppository -docusate sodium  (COLACE) 100 MG capsule -Gabapentin  (NEURONTIN ) 300 MG capsule -glipiZIDE  (GLUCOTROL ) 10 MG tablet -HYDROcodone -acetaminophen  (NORCO/VICODIN) 5-325 MG tablet -lisinopril  (PRINIVIL ,ZESTRIL ) 5 MG tablet -metFORMIN  (GLUCOPHAGE ) 500 MG tablet -methocarbamol  (ROBAXIN ) 500 MG tablet -omeprazole (PRILOSEC) 40 MG capsule -ondansetron  (ZOFRAN ) 4 MG tablet -polyethylene glycol (MIRALAX  / GLYCOLAX ) packet -rivaroxaban  (XARELTO ) 10 MG TABS tablet -tamsulosin  (FLOMAX ) 0.4 MG CAPS capsule   ADDED  -Cefadroxil  500 mg BID (will finish on 04/10/2023)  -Melatonin 3mg  at bed time  -acetaminophen  (TYLENOL ) 325 MG tablet-Every 6 hours PRN    MODIFIED  -Protonix  changed from 40 mg daily to 40 mg BID    Follow-up Appointments:  Contact information for follow-up providers     Hartwell Area, PA-C. Schedule an appointment as soon as possible for a visit  in 2 week(s).   Specialty: Physician Assistant Contact information: [address] [place] [address] [phone]              Contact information for after-discharge care     Destination     [facility] .   Service: Skilled Nursing Contact information: [address] [place]  [phone]                     Hospital Course by problem list: 89 year old male with impaired mobility, bedbound for the last year or so, has been boarding in the emergency department for the last 6 days awaiting placement as family can no longer take care of at home.  Overnight he became hypotensive and we are admitting him for undifferentiated shock.  Hypovolemic Shock Patient was hypotensive with MAP as low as 52, uncertain etiology.  During initial evaluation, he was not tachycardic febrile.  We did an initial infectious workup which did show findings consistent  with a UTI on his urinalysis.  Chest x-ray did not show any findings suspicious for pneumonia.  Blood cultures remained no grow,  urine culture grew KLEBSIELLA PNEUMONIAE. I suspect that he has become hypovolemic over several days in the emergency department due to poor oral intake and starting HTN medications that patient was not complaint with at home.   Elevated BUN/creatinine and hemoconcentration that reverted to baseline after fluid resuscitation support this.  Appreciated input from critical care, they saw him and recommended MAP goal of 55 and boosting blood pressure with midodrine  for now.  Patient received IV fluids, midodrine , and we held off on home blood pressure medications.  Midodrine  was discontinued.  Blood pressure improved with these interventions. Hypovolemic shock resolved.   AKI Patient is AKI has improved with IVF. AKI is most likely due to hypovolemia.  Patient serum creatinine was 1.33 on day of discharge.  During hospitalization, we held lisinopril  5 mg.  Loose  stool  Possibly due to restarting metformin  500 mg, per dispense history, patient was not feeling metformin .  Per family report, it appears that wife struggled with cognition therefore unsure if patient was actually receiving metformin .  Loose stool is most likely from initiation of metformin  during his stay boarded in the ED.   Type 2 diabetes On a home regimen of Metformin  and glipizide .  An A1c was collected which was 7.9.  Patient's home regimen was held upon admission and not continued on discharge.   UTI Chronic indwelling foley cathter  BPH Per patient's family, indwelling Foley catheter was placed 1 year ago while patient was hospitalized for pneumonia.  It sounds like the catheter was placed for acute urinary retention and possibly from BPH.  Patient has never followed up with urology outpatient.  It was reported that the Foley catheter was changed to 3 weeks ago by a home health RN.  Patient was started on ceftriaxone .  Foley catheter was originally replaced and removed for void trial which was successful, he was discharged without the foley catheter. Urine culture grew KLEBSIELLA PNEUMONIAE and he was transitioned from ceftriaxone  to cefadroxil  for a total ABX therapy of 7 days .  Anemia Hemoglobin 10.1 on initial presentation to the ED, after several days it increased to 15.5, suspect from hemoconcentration.  After fluid resuscitation it is down to 9.5.  Baseline wavers, but hemoglobin around 9 does not appear unusual for him based on review of prior labs. Hemoglobin remained stable throughout hospitalization. Iron panel showed a saturation ratio of 9, ferritin was decreased at 12, B12 was 306, and B9 was 17.1. Recommend outpatient evaluation of risk vs benefit of IV iron.   H/o Oropharyngeal Dysphagia  Aspiration precautions placed   Acquired Hypothyroidism Patient is on a home regimen of Synthroid  75 mcg, TSH was checked which was 3.290 and patient was continued on his home regimen of  Synthroid  75 mcg.  Sacral decubitus ulcer Covered with foam, exposed for exam, looks okay.  Do not think this is a source of infection.  Wound care evaluated and recommended cleanse sacral wound with Vashe daily, apply hydrogel for moisture, top with dressing.   Failure to thrive Patient was brought to the emergency department via family for failure to thrive. Family is no longer able to provide care for him. He was boarded in the ED for 6 days awaiting placement to SNF. During these 6 days, his home medication regimen was started. Per family, the wife was administering the medications; however, the wife was  struggling to care for herself as well and it was believed that he was not receiving these medications. Dispense history confirms that the patient was not receiving his home medications regularly. After 6 days, he became hypotensive which was the reason for admission to the hospital with IMTS.    Discharge Subjective: Patient reported a headache this morning. His headache is not unusual for him and it is similar to his headaches in the past. Other than that, he reports feeling well. He is ready for discharge.   Discharge Exam:   Blood pressure 130/69, pulse 62, temperature (!) 97.5 F (36.4 C), temperature source Oral, resp. rate 17, height 5' 6 (1.676 m), weight 78 kg, SpO2 96%.  Constitutional:Well-appearing, elderly, sitting in bed, in no acute distress HENT: normocephalic atraumatic Cardiovascular: regular rate and rhythm, no m/r/g Pulmonary/Chest: normal work of breathing on room air,  Abdominal: soft, non-tender, non-distended. Neurological: alert & awake, participating in conversation  Skin: warm and dry Psych: Normal mood and affect  Pertinent Labs, Studies, and Procedures:     Latest Ref Rng & Units 04/06/2023    2:42 AM 04/04/2023    2:15 AM 04/03/2023    3:49 PM  CBC  WBC 4.0 - 10.5 K/uL 8.1  7.6  11.4   Hemoglobin 13.0 - 17.0 g/dL 9.1  8.3  9.5   Hematocrit 39.0 - 52.0 %  27.6  25.2  28.9   Platelets 150 - 400 K/uL 262  230  243        Latest Ref Rng & Units 04/07/2023    2:51 AM 04/06/2023    2:42 AM 04/04/2023    2:15 AM  CMP  Glucose 70 - 99 mg/dL 850  866  63   BUN 8 - 23 mg/dL 36  43  44   Creatinine 0.61 - 1.24 mg/dL 8.66  8.50  8.74   Sodium 135 - 145 mmol/L 136  136  134   Potassium 3.5 - 5.1 mmol/L 4.3  4.3  4.9   Chloride 98 - 111 mmol/L 102  103  104   CO2 22 - 32 mmol/L 24  23  20    Calcium  8.9 - 10.3 mg/dL 8.8  8.6  8.7   Total Protein 6.5 - 8.1 g/dL   5.2   Total Bilirubin 0.0 - 1.2 mg/dL   0.4   Alkaline Phos 38 - 126 U/L   73   AST 15 - 41 U/L   17   ALT 0 - 44 U/L   12     No results found.   Discharge Instructions: Discharge Instructions     Call MD for:  difficulty breathing, headache or visual disturbances   Complete by: As directed    Call MD for:  extreme fatigue   Complete by: As directed    Call MD for:  hives   Complete by: As directed    Call MD for:  persistant dizziness or light-headedness   Complete by: As directed    Call MD for:  persistant nausea and vomiting   Complete by: As directed    Call MD for:  redness, tenderness, or signs of infection (pain, swelling, redness, odor or green/yellow discharge around incision site)   Complete by: As directed    Call MD for:  severe uncontrolled pain   Complete by: As directed    Call MD for:  temperature >100.4   Complete by: As directed    Diet - low sodium heart healthy  Complete by: As directed    Discharge instructions   Complete by: As directed    You came to the hospital for failure to thrive and you were diagnosed with UTI.  We treated you with IV fluids and antibiotics    *For your UTI  -We have started you on these following medications:  -Cefadroxil  500 mg twice a day, you will take one tablet every 12 hours until you finish the antibiotics, your first dose will be this evening.  -Please see PCP in 1-2 weeks   We have continued these medications:   -atorvastatin  (LIPITOR) 10 MG tablet -Cholecalciferol (VITAMIN D3) 2000 units TABS -levothyroxine  (SYNTHROID , LEVOTHROID) 75 MCG tablet -Multiple Vitamin (MULTIVITAMIN WITH MINERALS) TABS tablet   Please note that we have stopped the following medications:  -bisacodyl  (DULCOLAX) 10 MG suppository -docusate sodium  (COLACE) 100 MG capsule -Gabapentin  (NEURONTIN ) 300 MG capsule -glipiZIDE  (GLUCOTROL ) 10 MG tablet -HYDROcodone -acetaminophen  (NORCO/VICODIN) 5-325 MG tablet -lisinopril  (PRINIVIL ,ZESTRIL ) 5 MG tablet -metFORMIN  (GLUCOPHAGE ) 500 MG tablet -methocarbamol  (ROBAXIN ) 500 MG tablet -omeprazole (PRILOSEC) 40 MG capsule -ondansetron  (ZOFRAN ) 4 MG tablet -polyethylene glycol (MIRALAX  / GLYCOLAX ) packet -rivaroxaban  (XARELTO ) 10 MG TABS tablet -tamsulosin  (FLOMAX ) 0.4 MG CAPS capsule    Follow-up appointments: Please visit your family doctor in 7 to 10 days  If you have any questions or concerns please feel free to call: Internal medicine clinic at [phone]   If you have any of these following symptoms, please call us  or seek care at an emergency department: -Chest Pain -Difficulty Breathing -Syncope (passing out) -Drooping of face -Slurred speech -Sudden weakness in your leg or arm -Fever -Chills   We are glad that you are feeling better, it was a pleasure to care for you!  Damien Lease DO   Discharge wound care:   Complete by: As directed    See above,   Pressure Injury 04/04/23 Sacrum Mid Stage 2 -  Partial thickness loss of dermis presenting as a shallow open injury with a red, pink wound bed without slough. red, open, non-painful  Wound care  Daily      Comments: Cleanse sacral wound with Vashe Soila # [phone]), pat dry. Apply hydrogel (Lawson # [phone]) to gauze and place in wound bed, top with dry dressing. Change daily  Cleanse sacral wound with Vashe daily, apply hydrogel for moisture, top with dressing.  Manage arm wound with skin care order set,  silicone foam appropriate   Increase activity slowly   Complete by: As directed        Signed: Damien Lease DO [facility] - PGY1 Pager: [phone] 04/07/2023, 10:30 AM    Please contact the on call pager after 5 pm and on weekends at [phone].

## 2023-04-07 NOTE — Plan of Care (Signed)

## 2023-04-09 LAB — CULTURE, BLOOD (SINGLE)
Culture: NO GROWTH
Special Requests: ADEQUATE

## 2023-04-28 NOTE — Progress Notes (Signed)
Documentation clarficiation.  UTI is due to foley catheter.

## 2023-04-30 ENCOUNTER — Other Ambulatory Visit: Payer: Self-pay

## 2023-04-30 ENCOUNTER — Emergency Department (HOSPITAL_COMMUNITY): Payer: Medicare Other

## 2023-04-30 ENCOUNTER — Inpatient Hospital Stay (HOSPITAL_COMMUNITY)
Admission: EM | Admit: 2023-04-30 | Discharge: 2023-05-04 | DRG: 871 | Disposition: A | Discharge status: Skilled Nursing Facility | Payer: Medicare Other | Attending: Internal Medicine | Admitting: Internal Medicine

## 2023-04-30 DIAGNOSIS — R739 Hyperglycemia, unspecified: Secondary | ICD-10-CM | POA: Insufficient documentation

## 2023-04-30 DIAGNOSIS — Z79890 Hormone replacement therapy (postmenopausal): Secondary | ICD-10-CM | POA: Diagnosis not present

## 2023-04-30 DIAGNOSIS — R54 Age-related physical debility: Secondary | ICD-10-CM | POA: Insufficient documentation

## 2023-04-30 DIAGNOSIS — I130 Hypertensive heart and chronic kidney disease with heart failure and stage 1 through stage 4 chronic kidney disease, or unspecified chronic kidney disease: Secondary | ICD-10-CM | POA: Diagnosis present

## 2023-04-30 DIAGNOSIS — N1831 Chronic kidney disease, stage 3a: Secondary | ICD-10-CM | POA: Diagnosis present

## 2023-04-30 DIAGNOSIS — E871 Hypo-osmolality and hyponatremia: Secondary | ICD-10-CM | POA: Diagnosis present

## 2023-04-30 DIAGNOSIS — I5032 Chronic diastolic (congestive) heart failure: Secondary | ICD-10-CM | POA: Diagnosis present

## 2023-04-30 DIAGNOSIS — N179 Acute kidney failure, unspecified: Secondary | ICD-10-CM | POA: Diagnosis present

## 2023-04-30 DIAGNOSIS — R1310 Dysphagia, unspecified: Secondary | ICD-10-CM | POA: Diagnosis present

## 2023-04-30 DIAGNOSIS — N183 Chronic kidney disease, stage 3 unspecified: Secondary | ICD-10-CM | POA: Insufficient documentation

## 2023-04-30 DIAGNOSIS — B965 Pseudomonas (aeruginosa) (mallei) (pseudomallei) as the cause of diseases classified elsewhere: Secondary | ICD-10-CM | POA: Diagnosis present

## 2023-04-30 DIAGNOSIS — E039 Hypothyroidism, unspecified: Secondary | ICD-10-CM | POA: Diagnosis present

## 2023-04-30 DIAGNOSIS — F0390 Unspecified dementia without behavioral disturbance: Secondary | ICD-10-CM | POA: Insufficient documentation

## 2023-04-30 DIAGNOSIS — Z7409 Other reduced mobility: Secondary | ICD-10-CM | POA: Diagnosis present

## 2023-04-30 DIAGNOSIS — G9340 Encephalopathy, unspecified: Secondary | ICD-10-CM | POA: Diagnosis present

## 2023-04-30 DIAGNOSIS — I2510 Atherosclerotic heart disease of native coronary artery without angina pectoris: Secondary | ICD-10-CM | POA: Diagnosis present

## 2023-04-30 DIAGNOSIS — E1122 Type 2 diabetes mellitus with diabetic chronic kidney disease: Secondary | ICD-10-CM | POA: Diagnosis present

## 2023-04-30 DIAGNOSIS — R4182 Altered mental status, unspecified: Principal | ICD-10-CM

## 2023-04-30 DIAGNOSIS — E1165 Type 2 diabetes mellitus with hyperglycemia: Secondary | ICD-10-CM | POA: Diagnosis present

## 2023-04-30 DIAGNOSIS — E870 Hyperosmolality and hypernatremia: Secondary | ICD-10-CM | POA: Diagnosis present

## 2023-04-30 DIAGNOSIS — R6520 Severe sepsis without septic shock: Secondary | ICD-10-CM | POA: Diagnosis present

## 2023-04-30 DIAGNOSIS — E872 Acidosis, unspecified: Secondary | ICD-10-CM | POA: Diagnosis present

## 2023-04-30 DIAGNOSIS — N390 Urinary tract infection, site not specified: Secondary | ICD-10-CM | POA: Diagnosis present

## 2023-04-30 DIAGNOSIS — Z79899 Other long term (current) drug therapy: Secondary | ICD-10-CM

## 2023-04-30 DIAGNOSIS — E1151 Type 2 diabetes mellitus with diabetic peripheral angiopathy without gangrene: Secondary | ICD-10-CM | POA: Diagnosis present

## 2023-04-30 DIAGNOSIS — A419 Sepsis, unspecified organism: Principal | ICD-10-CM | POA: Diagnosis present

## 2023-04-30 DIAGNOSIS — R338 Other retention of urine: Secondary | ICD-10-CM | POA: Diagnosis present

## 2023-04-30 DIAGNOSIS — E119 Type 2 diabetes mellitus without complications: Secondary | ICD-10-CM

## 2023-04-30 DIAGNOSIS — L89153 Pressure ulcer of sacral region, stage 3: Secondary | ICD-10-CM | POA: Diagnosis present

## 2023-04-30 DIAGNOSIS — I4891 Unspecified atrial fibrillation: Secondary | ICD-10-CM | POA: Diagnosis present

## 2023-04-30 DIAGNOSIS — G9341 Metabolic encephalopathy: Secondary | ICD-10-CM | POA: Diagnosis present

## 2023-04-30 DIAGNOSIS — E860 Dehydration: Secondary | ICD-10-CM | POA: Diagnosis present

## 2023-04-30 DIAGNOSIS — Z66 Do not resuscitate: Secondary | ICD-10-CM | POA: Diagnosis present

## 2023-04-30 DIAGNOSIS — N400 Enlarged prostate without lower urinary tract symptoms: Secondary | ICD-10-CM | POA: Insufficient documentation

## 2023-04-30 DIAGNOSIS — R627 Adult failure to thrive: Secondary | ICD-10-CM | POA: Diagnosis present

## 2023-04-30 DIAGNOSIS — N401 Enlarged prostate with lower urinary tract symptoms: Secondary | ICD-10-CM | POA: Diagnosis present

## 2023-04-30 DIAGNOSIS — Z6827 Body mass index (BMI) 27.0-27.9, adult: Secondary | ICD-10-CM

## 2023-04-30 LAB — I-STAT CHEM 8, ED
BUN: 59 mg/dL — ABNORMAL HIGH (ref 8–23)
Calcium, Ion: 1.16 mmol/L (ref 1.15–1.40)
Chloride: 119 mmol/L — ABNORMAL HIGH (ref 98–111)
Creatinine, Ser: 1.3 mg/dL — ABNORMAL HIGH (ref 0.61–1.24)
Glucose, Bld: 392 mg/dL — ABNORMAL HIGH (ref 70–99)
HCT: 38 % — ABNORMAL LOW (ref 39.0–52.0)
Hemoglobin: 12.9 g/dL — ABNORMAL LOW (ref 13.0–17.0)
Potassium: 4.5 mmol/L (ref 3.5–5.1)
Sodium: 152 mmol/L — ABNORMAL HIGH (ref 135–145)
TCO2: 23 mmol/L (ref 22–32)

## 2023-04-30 LAB — BASIC METABOLIC PANEL
Anion gap: 13 (ref 5–15)
BUN: 56 mg/dL — ABNORMAL HIGH (ref 8–23)
CO2: 24 mmol/L (ref 22–32)
Calcium: 10.3 mg/dL (ref 8.9–10.3)
Chloride: 116 mmol/L — ABNORMAL HIGH (ref 98–111)
Creatinine, Ser: 1.57 mg/dL — ABNORMAL HIGH (ref 0.61–1.24)
GFR, Estimated: 42 mL/min — ABNORMAL LOW (ref >60)
Glucose, Bld: 410 mg/dL — ABNORMAL HIGH (ref 70–99)
Potassium: 3.6 mmol/L (ref 3.5–5.1)
Sodium: 153 mmol/L — ABNORMAL HIGH (ref 135–145)

## 2023-04-30 LAB — CBG MONITORING, ED
Glucose-Capillary: 377 mg/dL — ABNORMAL HIGH (ref 70–99)
Glucose-Capillary: 392 mg/dL — ABNORMAL HIGH (ref 70–99)

## 2023-04-30 LAB — CBC WITH DIFFERENTIAL/PLATELET
Abs Immature Granulocytes: 0.09 10*3/uL — ABNORMAL HIGH (ref 0.00–0.07)
Basophils Absolute: 0.1 10*3/uL (ref 0.0–0.1)
Basophils Relative: 0 %
Eosinophils Absolute: 0 10*3/uL (ref 0.0–0.5)
Eosinophils Relative: 0 %
HCT: 44.8 % (ref 39.0–52.0)
Hemoglobin: 13.9 g/dL (ref 13.0–17.0)
Immature Granulocytes: 0 %
Lymphocytes Relative: 4 %
Lymphs Abs: 0.8 10*3/uL (ref 0.7–4.0)
MCH: 28.6 pg (ref 26.0–34.0)
MCHC: 31 g/dL (ref 30.0–36.0)
MCV: 92.2 fL (ref 80.0–100.0)
Monocytes Absolute: 0.8 10*3/uL (ref 0.1–1.0)
Monocytes Relative: 4 %
Neutro Abs: 18.5 10*3/uL — ABNORMAL HIGH (ref 1.7–7.7)
Neutrophils Relative %: 92 %
Platelets: 394 10*3/uL (ref 150–400)
RBC: 4.86 MIL/uL (ref 4.22–5.81)
RDW: 16.3 % — ABNORMAL HIGH (ref 11.5–15.5)
WBC: 20.2 10*3/uL — ABNORMAL HIGH (ref 4.0–10.5)
nRBC: 0 % (ref 0.0–0.2)

## 2023-04-30 LAB — I-STAT VENOUS BLOOD GAS, ED
Acid-Base Excess: 1 mmol/L (ref 0.0–2.0)
Bicarbonate: 25 mmol/L (ref 20.0–28.0)
Calcium, Ion: 1.25 mmol/L (ref 1.15–1.40)
HCT: 42 % (ref 39.0–52.0)
Hemoglobin: 14.3 g/dL (ref 13.0–17.0)
O2 Saturation: 89 %
Potassium: 3.4 mmol/L — ABNORMAL LOW (ref 3.5–5.1)
Sodium: 156 mmol/L — ABNORMAL HIGH (ref 135–145)
TCO2: 26 mmol/L (ref 22–32)
pCO2, Ven: 35.5 mm[Hg] — ABNORMAL LOW (ref 44–60)
pH, Ven: 7.455 — ABNORMAL HIGH (ref 7.25–7.43)
pO2, Ven: 54 mm[Hg] — ABNORMAL HIGH (ref 32–45)

## 2023-04-30 LAB — PROTIME-INR
INR: 1.1 (ref 0.8–1.2)
Prothrombin Time: 14.8 s (ref 11.4–15.2)

## 2023-04-30 LAB — GLUCOSE, CAPILLARY
Glucose-Capillary: 391 mg/dL — ABNORMAL HIGH (ref 70–99)
Glucose-Capillary: 277 mg/dL — ABNORMAL HIGH (ref 70–99)

## 2023-04-30 LAB — URINALYSIS, W/ REFLEX TO CULTURE (INFECTION SUSPECTED)
Bilirubin Urine: NEGATIVE
Glucose, UA: >=500 mg/dL — AB
Ketones, ur: NEGATIVE mg/dL
Nitrite: NEGATIVE
Protein, ur: 100 mg/dL — AB
Specific Gravity, Urine: 1.021 (ref 1.005–1.030)
pH: 5 (ref 5.0–8.0)

## 2023-04-30 LAB — LACTIC ACID, PLASMA: Lactic Acid, Venous: 1.8 mmol/L (ref 0.5–1.9)

## 2023-04-30 LAB — I-STAT CG4 LACTIC ACID, ED: Lactic Acid, Venous: 3.2 mmol/L (ref 0.5–1.9)

## 2023-04-30 LAB — COMPREHENSIVE METABOLIC PANEL
ALT: 13 U/L (ref 0–44)
AST: 19 U/L (ref 15–41)
Albumin: 4.1 g/dL (ref 3.5–5.0)
Alkaline Phosphatase: 111 U/L (ref 38–126)
Anion gap: 13 (ref 5–15)
BUN: 64 mg/dL — ABNORMAL HIGH (ref 8–23)
CO2: 23 mmol/L (ref 22–32)
Calcium: 10.6 mg/dL — ABNORMAL HIGH (ref 8.9–10.3)
Chloride: 117 mmol/L — ABNORMAL HIGH (ref 98–111)
Creatinine, Ser: 1.67 mg/dL — ABNORMAL HIGH (ref 0.61–1.24)
GFR, Estimated: 39 mL/min — ABNORMAL LOW (ref >60)
Glucose, Bld: 426 mg/dL — ABNORMAL HIGH (ref 70–99)
Potassium: 3.5 mmol/L (ref 3.5–5.1)
Sodium: 153 mmol/L — ABNORMAL HIGH (ref 135–145)
Total Bilirubin: 0.6 mg/dL (ref 0.0–1.2)
Total Protein: 7.8 g/dL (ref 6.5–8.1)

## 2023-04-30 LAB — PROCALCITONIN: Procalcitonin: <0.1 ng/mL

## 2023-04-30 LAB — RESP PANEL BY RT-PCR (RSV, FLU A&B, COVID)  RVPGX2
Influenza A by PCR: NEGATIVE
Influenza B by PCR: NEGATIVE
Resp Syncytial Virus by PCR: NEGATIVE
SARS Coronavirus 2 by RT PCR: NEGATIVE

## 2023-04-30 LAB — BETA-HYDROXYBUTYRIC ACID: Beta-Hydroxybutyric Acid: 0.16 mmol/L (ref 0.05–0.27)

## 2023-04-30 LAB — APTT: aPTT: 35 s (ref 24–36)

## 2023-04-30 LAB — TSH: TSH: 1.04 u[IU]/mL (ref 0.350–4.500)

## 2023-04-30 MED ORDER — DEXTROSE 5 % IV SOLN: INTRAVENOUS | Status: AC

## 2023-04-30 MED ORDER — ENOXAPARIN SODIUM 40 MG/0.4ML IJ SOSY
40.0000 mg | PREFILLED_SYRINGE | INTRAMUSCULAR | Status: DC
Start: 2023-04-30 — End: 2023-05-05
  Administered 2023-04-30 – 2023-05-04 (×5): 40 mg via SUBCUTANEOUS
  Filled 2023-04-30 (×5): qty 0.4

## 2023-04-30 MED ORDER — METRONIDAZOLE 500 MG/100ML IV SOLN
500.0000 mg | Freq: Once | INTRAVENOUS | Status: AC
  Administered 2023-04-30: 500 mg via INTRAVENOUS
  Filled 2023-04-30: qty 100

## 2023-04-30 MED ORDER — LACTATED RINGERS IV BOLUS (SEPSIS)
1000.0000 mL | Freq: Once | INTRAVENOUS | Status: AC
  Administered 2023-04-30: 1000 mL via INTRAVENOUS

## 2023-04-30 MED ORDER — LEVOTHYROXINE SODIUM 75 MCG PO TABS
75.0000 ug | ORAL_TABLET | Freq: Every day | ORAL | Status: DC
  Administered 2023-05-02 – 2023-05-04 (×3): 75 ug via ORAL
  Filled 2023-04-30 (×3): qty 1

## 2023-04-30 MED ORDER — INSULIN ASPART 100 UNIT/ML IJ SOLN
0.0000 [IU] | INTRAMUSCULAR | Status: DC
  Administered 2023-04-30: 15 [IU] via SUBCUTANEOUS
  Administered 2023-04-30: 8 [IU] via SUBCUTANEOUS
  Administered 2023-05-01 (×3): 3 [IU] via SUBCUTANEOUS

## 2023-04-30 MED ORDER — LACTATED RINGERS IV SOLN: INTRAVENOUS | Status: DC

## 2023-04-30 MED ORDER — VANCOMYCIN HCL 500 MG/100ML IV SOLN
500.0000 mg | Freq: Once | INTRAVENOUS | Status: DC
  Filled 2023-04-30: qty 100

## 2023-04-30 MED ORDER — SODIUM CHLORIDE 0.9 % IV SOLN
2.0000 g | Freq: Once | INTRAVENOUS | Status: AC
  Administered 2023-04-30: 2 g via INTRAVENOUS
  Filled 2023-04-30: qty 12.5

## 2023-04-30 MED ORDER — VANCOMYCIN HCL IN DEXTROSE 1-5 GM/200ML-% IV SOLN
1000.0000 mg | Freq: Once | INTRAVENOUS | Status: AC
  Administered 2023-04-30: 1000 mg via INTRAVENOUS
  Filled 2023-04-30: qty 200

## 2023-04-30 NOTE — Hospital Course (Addendum)
#   Acute encephalopathy # Sepsis # Hyperglycemia  # UTI # hypernatremia On presentation, he was somnolent, and non responsive.  Work up in the ED showed CBG of 426, labs with hypernatremia of 153, normal beta hydroxy butyrate.  He was afebrile, leukocytosis to 20K and mild lactic elevation 3.2--> 1.8. On further chart review, patient's metformin and glipizide were held on his last admission.   Patient acute encephalopathy thought to be multifactorial, in the setting of sepsis, hyperglycemia, hyponatremia and UTI. Hyperglycemia secondary to being off his antidiabetes meds.  Hypernatremia secondary to decreased p.o. intake.   UA was concerning for UTI, and urine culture grew Pseudomonas aeruginosa. He received treatment of his hypernatremia with D5 W IVF's.  For his hyperglycemia, was treated with 10 units of long-acting insulin and SSI.  For UTI, urine culture grew Pseudomonas aeruginosa, was treated with IV cefepime, will need to complete 2 more doses of Levaquin 750 mg for 2 more doses  at SNF.    # Type 2 Diabetes Blood glucose elevated initially, improved with 10 units of Semglee, and SSI.  Home regimen includes metformin and glipizide which we are holding on discharge.   - Please continue 10 units of Semglee after discharge. - Please follow-up with PCP about restarting metformin.   Complicated UTI UA consistent with UTI, urine culture grew Pseudomonas aeruginosa.  Will treat complicated UTI with 7 days of antibiotics, will need 3 more doses to complete a course.  -Continue Levaquin 750 mg every 48 hours for 2 more doses   # Acute Kidney injury Serum creatinine elevated on presentation, due to decreased p.o. intake, improved with IV fluids.  On day of discharge, SCr is 1.34.  # Concern for aspiration pneumonia Patient was evaluated by SLP, noted to be at high risk for aspiration.  Modified barium swallow negative for aspiration, with on going penetration with liquids.  Swallow Evaluation  Recommendations Recommendations: PO diet PO Diet Recommendation: Dysphagia 1 (Pureed);Thin liquids (Level 0) Liquid Administration via: Cup;Straw Medication Administration: Crushed with puree Supervision: Staff to assist with self-feeding;Full supervision/cueing for swallowing strategies Swallowing strategies  : Minimize environmental distractions;Slow rate;Small bites/sips;Hard cough after swallowing (one sip at a time; cough intermittently) Postural changes: Position pt fully upright for meals;Stay upright 30-60 min after meals Oral care recommendations: Oral care BID (2x/day)  # Hypothyroidism TSH normal. Continued with synthroid 75 mcg.

## 2023-04-30 NOTE — ED Provider Notes (Signed)
Shared PA visit.  Patient here with altered mental status.  History of recurrent UTI CAD dementia.  Unremarkable vitals except for low-grade fever and tachycardia.  Differential is broad including sepsis/infectious process, dehydration electrolyte abnormality.  No signs of trauma.  Seems less likely to be neurologic process.  Broad workup initiated with labs including blood cultures lactic acid CT head chest x-ray urinalysis.  Patient with leukocytosis of 20 hypernatremia of 153.  pH is 7.4.  Lactic acid 3.2.  Overall he is not in DKA.  Does appear that he is likely with infection from likely UTI and dehydration with a sodium of 153.  Creatinine mildly elevated 1.6.  Possible infiltrate on x-ray.  Awaiting urinalysis head CT will start some IV antibiotics anticipate admission for sepsis care in the setting of electrolyte derangement.  Patient is DNR/DNI.  This chart was dictated using voice recognition software.  Despite best efforts to proofread,  errors can occur which can change the documentation meaning.    Virgina Norfolk, DO 04/30/23 1203

## 2023-04-30 NOTE — ED Provider Notes (Signed)
[place] EMERGENCY DEPARTMENT AT [facility] Provider Note   CSN: [id number] Arrival date & time: 04/30/23  1191     History  Chief Complaint  Patient presents with   Altered Mental Status    hy   Hyperglycemia    Nathaniel Henry is a 89 y.o. male with past medical baseline confusion, recurrent urinary tract infections, type 2 diabetes, CAD, peripheral vascular disease, dysphagia.  Patient was admitted this month with failure to thrive.  He had A-fib with RVR and had Xarelto but that was stopped.  He also had his diabetes medications, glipizide and metformin discontinued at discharge.  EMS was called to his nursing care facility, [facility] for altered mental status.  EMS reports that the nursing facility was not able to give them very much information except they noted his blood sugar was elevated to 400 yesterday.  He got hypodermic lysis with fluids and they rechecked it and today it was about 380.  They states that he is not on any medications for his blood sugar and that they "did not even know he was diabetic."  Patient has a DNR sheet with him and his MAR.  He was noted to be both tachycardic and tachypneic.  Patient is unable to give any history.  I was able to gather additional history from the patient's daughter-in-law, Nathaniel Henry who is a Proofreader.  Her husband is the healthcare power of attorney.  She confirms that the patient is DNR/DNI however they would like all other treatment measures taken.  She reports that he was recently placed at the skilled nursing facility due to his wife who is his primary caretaker being placed in cardiac ICU for heart failure and in need of an AVR.  She states that over the past week the nursing home has reported the patient has been refusing to eat or drink anything.  He has been very upset about change in his life and having new caretakers at this facility.  She reports that normally he has some mild  confusion but is otherwise awake and alert and able to talk to [place].   Altered Mental Status Hyperglycemia Associated symptoms: altered mental status        Home Medications Prior to Admission medications   Medication Sig Start Date End Date Taking? Authorizing Provider  acetaminophen (TYLENOL) 325 MG tablet Take 2 tablets (650 mg total) by mouth every 6 (six) hours as needed for mild pain (pain score 1-3), moderate pain (pain score 4-6), fever or headache. 04/07/23   Faith Rogue, DO  atorvastatin (LIPITOR) 10 MG tablet Take 10 mg by mouth every evening.    [provider]  cefadroxil (DURICEF) 500 MG capsule Take 1 capsule (500 mg total) by mouth 2 (two) times daily. 04/07/23   Faith Rogue, DO  Cholecalciferol (VITAMIN D3) 2000 units TABS Take 2,000 Units by mouth every other day. Alternates between a multivitamin and vitamin d3 supplement    [provider]  levothyroxine (SYNTHROID, LEVOTHROID) 75 MCG tablet Take 75 mcg by mouth daily before breakfast.    [provider]  melatonin 3 MG TABS tablet Take 1 tablet (3 mg total) by mouth at bedtime as needed. 04/07/23   Faith Rogue, DO  Multiple Vitamin (MULTIVITAMIN WITH MINERALS) TABS tablet Take 1 tablet by mouth every other day. Alternates between a multivitamin and vitamin d3 supplement    [provider]  pantoprazole (PROTONIX) 40 MG tablet Take 2 tablets (  80 mg total) by mouth daily. 04/07/23   Faith Rogue, DO      Allergies    Patient has no known allergies.    Review of Systems   Review of Systems  Physical Exam Updated Vital Signs BP 106/76   Pulse (!) 119   Temp 99 F (37.2 C) (Rectal)   Resp (!) 29   Ht 5\' 6"  (1.676 m)   Wt 78 kg   SpO2 100%   BMI 27.75 kg/m  Physical Exam Vitals and nursing note reviewed.  Constitutional:      Comments: Patient appears quite ill.  HENT:     Head: Normocephalic and atraumatic.     Mouth/Throat:     Mouth: Mucous membranes are dry.  Eyes:      Extraocular Movements: Extraocular movements intact.     Conjunctiva/sclera: Conjunctivae normal.     Pupils: Pupils are equal, round, and reactive to light.  Cardiovascular:     Rate and Rhythm: Tachycardia present.  Pulmonary:     Breath sounds: No wheezing.     Comments: tachypnea Abdominal:     Tenderness: There is abdominal tenderness in the suprapubic area.  Musculoskeletal:        General: No swelling or tenderness.     Cervical back: Normal range of motion and neck supple.  Skin:    General: Skin is warm.     Comments: Stage I pressure sore noted over the sacrum, 1 area of slight tissue breakdown in the very middle about 1 cm.  No evidence of infection  Neurological:     Mental Status: He is lethargic.     GCS: GCS eye subscore is 2. GCS verbal subscore is 4. GCS motor subscore is 6.     ED Results / Procedures / Treatments   Labs (all labs ordered are listed, but only abnormal results are displayed) Labs Reviewed  CBG MONITORING, ED - Abnormal; Notable for the following components:      Result Value   Glucose-Capillary 377 (*)    All other components within normal limits  CULTURE, BLOOD (ROUTINE X 2)  CULTURE, BLOOD (ROUTINE X 2)  RESP PANEL BY RT-PCR (RSV, FLU A&B, COVID)  RVPGX2  COMPREHENSIVE METABOLIC PANEL  CBC WITH DIFFERENTIAL/PLATELET  PROTIME-INR  APTT  URINALYSIS, W/ REFLEX TO CULTURE (INFECTION SUSPECTED)  BETA-HYDROXYBUTYRIC ACID  I-STAT CG4 LACTIC ACID, ED  I-STAT VENOUS BLOOD GAS, ED    EKG EKG Interpretation Date/Time:  Thursday April 30 2023 10:01:30 EST Ventricular Rate:  123 PR Interval:  137 QRS Duration:  116 QT Interval:  390 QTC Calculation: 558 R Axis:   -76  Text Interpretation: Sinus tachycardia Left anterior fascicular block LVH with secondary repolarization abnormality Confirmed by Virgina Norfolk [phone]) on 04/30/2023 10:09:08 AM  Radiology No results found.  Procedures Procedures    Medications Ordered in  ED Medications  lactated ringers bolus 1,000 mL (has no administration in time range)    ED Course/ Medical Decision Making/ A&P Clinical Course as of 04/30/23 1620  Thu Apr 30, 2023  1109 Lactic Acid, Venous(!!): 3.2 [AH]  1109 pH, Ven(!): 7.455 [AH]  1109 pCO2, Ven(!): 35.5 [AH]  1109 pO2, Ven(!): 54 [AH]  1109 Sodium(!): 156 [AH]  1109 WBC(!): 20.2 White count of 20.  I have ordered broad-spectrum antibiotics and ordered a code sepsis. [AH]  1244 Sodium(!): 153 [AH]  1244 Creatinine(!): 1.67 [AH]    Clinical Course User Index [AH] Arthor Captain, PA-C  Medical Decision Making Amount and/or Complexity of Data Reviewed Labs: ordered. Decision-making details documented in ED Course. Radiology: ordered.  Risk Prescription drug management. Decision regarding hospitalization.   This patient presents to the ED with chief complaint(s) of altered mental status and hyperglycemia with pertinent past medical history of diabetes, dementia, recurrent urinary tract infections, sacral wound and CAD which further complicates the presenting complaint. The complaint involves an extensive differential diagnosis and treatment options and also carries with it a high risk of complications and morbidity.    The differential diagnosis includes sepsis, DKA or hyperosmolar nonketotic syndrome, severe dehydration, uremia, encephalopathy, intracranial hemorrhage.  The initial plan is to order order sepsis labs, imaging, bladder scan and to treat patient with IV fluids for tachycardia and tachypnea   Additional history obtained: Additional history obtained from family and EMS  Records reviewed  SNF [facility]  Reassessment and review (also see workup area): Lab Tests: I Ordered, and personally interpreted labs.  The pertinent results include: Hypernatremia, lactic acidosis, respiratory alkalosis, elevated blood glucose, slightly elevated BUN/creatinine, white blood cell  count of 20,000 UA pending  Imaging Studies: I ordered and independently visualized and interpreted the following imaging  \chest x-ray   which showed no acute findings.  CT head pending The interpretation of the imaging was limited to assessing for emergent pathology, for which purpose it was ordered.  Consultations Obtained: I requested consultation with the admitting physician internal medicine teaching service , and discussed  findings as well as pertinent plan they will admit the patient.  Medicines ordered and prescription drug management: I ordered the following medications fluids and broad-spectrum antibiotics for potential sepsis    Reevaluation of the patient after these medicines showed that the patient    improved  Social Determinants of Health: Patient's  currently living in skilled nursing facility   increases the complexity of managing their presentation  Cardiac Monitoring: The patient was maintained on a cardiac monitor.  I personally viewed and interpreted the cardiac monitor which showed an underlying rhythm of:  sinus tachycardia  Complexity of problems addressed: Patient's presentation is most consistent with  acute presentation with potential threat to life or bodily function During patient's assessment  Disposition: After consideration of the diagnostic results and the patient's response to treatment,  I feel that the patent would benefit from admission for dehydration v sepsis. .         Final Clinical Impression(s) / ED Diagnoses Final diagnoses:  Altered mental status, unspecified altered mental status type  Hypernatremia    Rx / DC Orders ED Discharge Orders     None         Arthor Captain, PA-C 04/30/23 1624    Virgina Norfolk, DO 05/04/23 [phone]

## 2023-04-30 NOTE — Sepsis Progress Note (Signed)
Notified bedside nurse of need to draw repeat lactic acid.

## 2023-04-30 NOTE — Consult Note (Signed)
WOC Nurse Consult Note: Reason for Consult: Requested to assess a sacral wound prior admission Wound type: Pressure injury stage 3 Pressure Injury POA: Yes Measurement: aprox. 1.2x0.5cmx0.1cm Wound bed: 100% yellow Drainage (amount, consistency, odor) Minimum amount, serous. Periwound: roller wound edges, old scar surrounding. Dressing procedure/placement/frequency: Apply Xeroform on the wound bed, change daily. Cover with foam dressing, change every 3 days or PRN soiling.  WOC team will not plan to follow further.  Please reconsult if further assistance is needed. Thank-you,  Denyse Amass BSN, RN, [employer], WOC  (Pager: [phone])

## 2023-04-30 NOTE — Sepsis Progress Note (Signed)
Elink monitoring for the code sepsis protocol.

## 2023-04-30 NOTE — ED Notes (Signed)
Messaged pharmacy regarding missing dose of vanc. Med held until available.

## 2023-04-30 NOTE — ED Notes (Signed)
Unable to locate a vein to draw blood.  Pt. Is altered; will not lay his right arm straight.

## 2023-04-30 NOTE — Progress Notes (Signed)
Arrived to [facility]. Family came to bedside. Dr. Sherrilee Gilles with Internal Medicine Teaching Service updated family.      04/30/23 1745  Vitals  Temp 97.9 F (36.6 C)  Temp Source Axillary  BP (!) 171/152  MAP (mmHg) 160  BP Location Right Arm  BP Method Automatic  Patient Position (if appropriate) Lying  Pulse Rate (!) 102  Pulse Rate Source Dinamap  ECG Heart Rate (!) 102  Resp (!) 22  Level of Consciousness  Level of Consciousness Responds to Pain  MEWS COLOR  MEWS Score Color Red  Oxygen Therapy  SpO2 97 %  O2 Device Room Air  MEWS Score  MEWS Temp 0  MEWS Systolic 0  MEWS Pulse 1  MEWS RR 1  MEWS LOC 2  MEWS Score 4  Provider Notification  Provider Name/Title Dr. Chipper Herb / PGY-2 Internal Medicine  Date Provider Notified 04/30/23  Time Provider Notified 1758  Method of Notification Page  Notification Reason Other (Comment) (RED MEWS, elevated BP, RR, HR)  Provider response Evaluate remotely;See new orders  Date of Provider Response 04/30/23  Time of Provider Response [phone]

## 2023-04-30 NOTE — ED Notes (Signed)
Pt being transported to CT

## 2023-04-30 NOTE — H&P (Addendum)
Date: 04/30/2023         Patient Name:  Nathaniel Henry MRN: [id number]  DOB: 1932-08-22 Age / Sex: 89 y.o., male   PCP: Rubye Beach         Medical Service: Internal Medicine Teaching Service         Attending Physician: Dr. Reymundo Poll, MD     First Contact:  Dr. Laretta Bolster, MD Pager [phone]     Second Contact: Dr. Rana Snare, DO Pager [phone]         After Hours (After 5p/  First Contact    weekends / holidays): Second Contact     Chief Concern: Altered mental status  History of Present Illness: Nathaniel Henry is an 89 year old male with a history of T2DM, recurrent urinary tract infections (UTIs), and dementia, who presented to ED with AMS. The majority of the history was obtained from the HPI documented by the ED physician, along with collateral information from the patient's nurse at the skilled nursing facility.  Notably, the patient was recently hospitalized from January 5 to January 7 due to hypovolemic shock. At the time of discharge, both metformin and glipizide were withheld due to concerns that metformin was causing diarrhea. He was recently placed in the skilled nursing facility because his wife, who was his primary caretaker, is currently in cardiac intensive care unit (ICU) for heart failure and requires an AVR. Over the past week, the nursing home noted that he has  been refusing to eat or drink anything. He has been very upset about the changes in his life and his new caretakers at the facility. He has a history of dementia, and at baseline, he is alert and oriented to self and time.   The patient's last known normal state was on Tuesday, January 28. On Wednesday, January 29, he was noted to be confused. When his blood glucose (BG) was checked, was around 500 mg/dL. He received hypodermic lysis with fluids, and when rechecked on Thursday his BG was around 380 mg/dL.  EMS was called, and pt was brought to the ED. He was noted to be  tachycardic and Tachypneic   Allergies: None  Past Medical History:  HFpEF with an LVEF of 60 to 65% in 2023 Dysphagia, oropharyngeal Type 2 diabetes melitis  Acquired hypothyroidism GERD Urinary retention with indwelling foley catheter, BPH Impaired mobility HTN Dementia/memory loss Recurrent UTIs   Medications:  Atorvastatin 40 mg daily Finasteride 5 mg daily Glipizide 5 mg daily( discontinued at last hospitalization) Gabapentin 300 mg 3 times daily Lisinopril 5 mg daily Levothyroxine 75 mcg daily Metformin 500mg  (has not filled since last January) Omeprazole 40 mg Tamsulosin 0.4 mg daily  Surgical History:  -Total hip arthroplasty -Cervical spine surgery -Cataract surgery.  Family History:  None  Social History:  Lives: [address] [place] in [place]  Occupation: N/A Support: Wife, daughter/son-in-law?:  2-3 Level of Function: PCP: Jamal Collin, PA-C Substances: Denies tobacco, alcohol and illicit drug use(last admission.)  Physical Exam:  Blood pressure 129/78, pulse (!) 108, temperature 99 F (37.2 C), temperature source Rectal, resp. rate (!) 24, height 5\' 6"  (1.676 m), weight 78 kg, SpO2 100%.   Constitutional: Ill-appearing, somnolent, not arousable by sternal rub. HENT: + Dry mucous membranes. Cardiovascular: regular rate and rhythm,  Pulmonary/Chest: normal work of breathing on room air. Anterior lungs sounds clear. Abdominal: soft, mildly tender on the left abdomen, normal bowel sounds. Neurological: Not Alert and oriented. MSK: no gross  abnormalities. No pitting edema Skin: warm, stage I pressure sore noted over the sacrum, with slight skin breakdown in the very middle about 1 cm.  No evidence of infection   ED Workup Wbc 20.2 Hb 13.4 Lactic acidosis 3.2 Na 156 Scr 1.67  At the ED, he got 1 L of lactated Ringer's, and was started on IV antibiotics.  Images and other studies:   CT Head Wo Contrast IMPRESSION: No acute  intracranial pathology. 2. Moderate age-related atrophy and chronic microvascular ischemic changes.   Chest X- ray IMPRESSION: Minimal right basilar atelectasis or early infiltrate. Large hiatal hernia.    EKG:  Sinus tachycardia, no ST elevation or T wave inversions.  Assessment & Plan:  Nathaniel Henry is a 89 y.o.  with a hx of baseline dementia, recurrent UTI, diabetes who presented to the ED with altered mental status and admitted for acute encephalopathy.   Active Problems:   * No active hospital problems. *  # Acute encephalopathy # Sepsis # Hyperglycemia  # lactic acidosis # UTI # Leukocytosis I think patient's acute encephalopathy is multifactorial from hyperglycemia, hypernatremia and a possible infection.  He had no fever, WBC elevation to 20K, and mild lactic acidosis 3.2-->1.8. CXR was normal, RVP negative. He has a history of urinary retention, bladder scan at the ED showed that he was not retaining urine. UA positive for LE, glucosuria, was started on rocephin. I also considered HHS/DKA but less likely given normal bicarb and BHB. CNS etiology like a stroke or bleed was also considered, normal head CT, and no focal weakness makes this less likely,although he was somnolent, he was moving all extremities. On my exam, he was somnolent, had dry mucous membrane.  He failed his swallow study. He has a free water deficit of about 2 L, will replete with D5W.  Will treat hyperglycemia with sliding scale insulin.   - NPO( failed swallow study) - CBC - repeat BMP @ 10 PM,  -  repeat lactate - Follow-up UA. - Q4hrs SSI - D5W 143ml/hr for 10 hours. - Rocephin for UTI  # Type 2 Diabetes Blood glucose @ SNF > 500. At the ED BG 400.  Last A1c 7.8, three weeks ago. Home regimen includes metformin and glipizide; metformin was discontinued due to loose stools.   - CBG - Needs to be discharged with anti-diabetic agent.   # Failure to thrive  Has been worsening the past couple  months.  At the rehab, patient also has not been eating much.  I query if there is also depression superimposed on failure to thrive.  He is DNR/DNI, but family wants to treat the treatable.  Plan would be goals of care conversation with family in the setting of deteriorating health. - Goals of care conversation with family.  # Hx of recurrent UTI's. # BPH  Not endorsing dysuria, does not have an indwelling  catheter. Chronic hx of urinary retention requiring catheter for urinary drainage. Per chart review indwelling Foley catheter was placed 1 year ago while patient was hospitalized for pneumonia.  It sounds like the catheter was placed for acute urinary retention and possibly from BPH. Bladder scan showed that he was not retaining. He does not have a catheter.   - Follow UA   # Acute Kidney injury Scr 1.67. Baseline SCr 1.25-1.35. Suspect pre-renal etiology from decreased po intake, and not wanting to eat. BUN/Cr > 20.  No concern for an obstruction, given normal bladder scan. UA consistent with UTI.  -  BMP - Encourage po hydration. - Avoid nephrotoxic drug.  # Hypothyroidism TSH normal. Continued with synthroid 75 mcg.  Diet: NPO IVF: Enoxaparin. VTE:  Code: DNR/DNI.  Signed: Laretta Bolster, MD 04/30/2023, 2:19 PM

## 2023-04-30 NOTE — Sepsis Progress Note (Signed)
Unable to stick for repeat lactic acid, cannot find vein and pt is altered

## 2023-04-30 NOTE — Progress Notes (Signed)
MEWS Progress Note  Patient Details Name: Nathaniel Henry MRN: [id number] DOB: December 17, 1932 Today's Date: 04/30/2023   MEWS Flowsheet Documentation:  Assess: MEWS Score Temp: 97.9 F (36.6 C) BP: (!) 171/152 MAP (mmHg): 160 Pulse Rate: (!) 102 ECG Heart Rate: (!) 102 Resp: (!) 22 Level of Consciousness: Responds to Pain SpO2: 97 % O2 Device: Room Air Assess: MEWS Score MEWS Temp: 0 MEWS Systolic: 0 MEWS Pulse: 1 MEWS RR: 1 MEWS LOC: 2 MEWS Score: 4 MEWS Score Color: Red Assess: SIRS CRITERIA SIRS Temperature : 0 SIRS Respirations : 1 SIRS Pulse: 1 SIRS WBC: 1 SIRS Score Sum : 3 SIRS Temperature : 0 SIRS Pulse: 1 SIRS Respirations : 1 SIRS WBC: 1 SIRS Score Sum : 3 Assess: if the MEWS score is Yellow or Red Were vital signs accurate and taken at a resting state?: Yes Does the patient meet 2 or more of the SIRS criteria?: Yes Does the patient have a confirmed or suspected source of infection?: Yes MEWS guidelines implemented : Yes, red Treat MEWS Interventions: Considered administering scheduled or prn medications/treatments as ordered Take Vital Signs Increase Vital Sign Frequency : Red: Q1hr x2, continue Q4hrs until patient remains green for 12hrs Escalate MEWS: Escalate: Red: Discuss with charge nurse and notify provider. Consider notifying RRT. If remains red for 2 hours consider need for higher level of care Notify: Charge Nurse/RN Name of Charge Nurse/RN Notified: Bennett Scrape RN Provider Notification Provider Name/Title: Dr. Chipper Herb / PGY-2 Internal Medicine Date Provider Notified: 04/30/23 Time Provider Notified: 1758 Method of Notification: Page Notification Reason: Other (Comment) (RED MEWS, elevated BP, RR, HR) Provider response: Evaluate remotely, See new orders Date of Provider Response: 04/30/23 Time of Provider Response: 1804  Kallie Locks 04/30/2023, 6:19 PM

## 2023-04-30 NOTE — ED Triage Notes (Signed)
Pt bib [facility] from [facility].  LKW  last night. Nursing home reported pt sugar this morning was 480's.[facility] reported that nursing home gave hypodermoclysis . Pt then became unresponsive to staff members.  Upon arrival of ems pt was only responding to pain . GCS reproted 8.   Pt has hx of dementia.  HX  Dysphagia  TYIIDM  UTI (recurrent) CAD  Stage 1 pressure injury sacral.  405 cbg  120 hr NSR 136/76 94% RA  30RR 99 rectal    EMS gave 250 ml NS 20g Forearm (left)

## 2023-05-01 DIAGNOSIS — E870 Hyperosmolality and hypernatremia: Secondary | ICD-10-CM | POA: Insufficient documentation

## 2023-05-01 DIAGNOSIS — R739 Hyperglycemia, unspecified: Secondary | ICD-10-CM | POA: Insufficient documentation

## 2023-05-01 DIAGNOSIS — F0390 Unspecified dementia without behavioral disturbance: Secondary | ICD-10-CM | POA: Insufficient documentation

## 2023-05-01 DIAGNOSIS — R54 Age-related physical debility: Secondary | ICD-10-CM | POA: Insufficient documentation

## 2023-05-01 DIAGNOSIS — N400 Enlarged prostate without lower urinary tract symptoms: Secondary | ICD-10-CM | POA: Insufficient documentation

## 2023-05-01 DIAGNOSIS — N183 Chronic kidney disease, stage 3 unspecified: Secondary | ICD-10-CM | POA: Insufficient documentation

## 2023-05-01 DIAGNOSIS — G9340 Encephalopathy, unspecified: Secondary | ICD-10-CM | POA: Diagnosis not present

## 2023-05-01 LAB — CBC
HCT: 40.5 % (ref 39.0–52.0)
Hemoglobin: 12.6 g/dL — ABNORMAL LOW (ref 13.0–17.0)
MCH: 28.3 pg (ref 26.0–34.0)
MCHC: 31.1 g/dL (ref 30.0–36.0)
MCV: 91 fL (ref 80.0–100.0)
Platelets: 281 10*3/uL (ref 150–400)
RBC: 4.45 MIL/uL (ref 4.22–5.81)
RDW: 16.2 % — ABNORMAL HIGH (ref 11.5–15.5)
WBC: 14 10*3/uL — ABNORMAL HIGH (ref 4.0–10.5)
nRBC: 0 % (ref 0.0–0.2)

## 2023-05-01 LAB — BASIC METABOLIC PANEL
Anion gap: 13 (ref 5–15)
Anion gap: 11 (ref 5–15)
Anion gap: 14 (ref 5–15)
Anion gap: 12 (ref 5–15)
Anion gap: 11 (ref 5–15)
BUN: 53 mg/dL — ABNORMAL HIGH (ref 8–23)
BUN: 48 mg/dL — ABNORMAL HIGH (ref 8–23)
BUN: 45 mg/dL — ABNORMAL HIGH (ref 8–23)
BUN: 44 mg/dL — ABNORMAL HIGH (ref 8–23)
BUN: 43 mg/dL — ABNORMAL HIGH (ref 8–23)
CO2: 23 mmol/L (ref 22–32)
CO2: 25 mmol/L (ref 22–32)
CO2: 23 mmol/L (ref 22–32)
CO2: 24 mmol/L (ref 22–32)
CO2: 24 mmol/L (ref 22–32)
Calcium: 10.5 mg/dL — ABNORMAL HIGH (ref 8.9–10.3)
Calcium: 10.3 mg/dL (ref 8.9–10.3)
Calcium: 10.3 mg/dL (ref 8.9–10.3)
Calcium: 9.8 mg/dL (ref 8.9–10.3)
Calcium: 10.1 mg/dL (ref 8.9–10.3)
Chloride: 119 mmol/L — ABNORMAL HIGH (ref 98–111)
Chloride: 116 mmol/L — ABNORMAL HIGH (ref 98–111)
Chloride: 114 mmol/L — ABNORMAL HIGH (ref 98–111)
Chloride: 113 mmol/L — ABNORMAL HIGH (ref 98–111)
Chloride: 113 mmol/L — ABNORMAL HIGH (ref 98–111)
Creatinine, Ser: 1.44 mg/dL — ABNORMAL HIGH (ref 0.61–1.24)
Creatinine, Ser: 1.36 mg/dL — ABNORMAL HIGH (ref 0.61–1.24)
Creatinine, Ser: 1.29 mg/dL — ABNORMAL HIGH (ref 0.61–1.24)
Creatinine, Ser: 1.43 mg/dL — ABNORMAL HIGH (ref 0.61–1.24)
Creatinine, Ser: 1.37 mg/dL — ABNORMAL HIGH (ref 0.61–1.24)
GFR, Estimated: 47 mL/min — ABNORMAL LOW (ref >60)
GFR, Estimated: 50 mL/min — ABNORMAL LOW (ref >60)
GFR, Estimated: 53 mL/min — ABNORMAL LOW (ref >60)
GFR, Estimated: 47 mL/min — ABNORMAL LOW (ref >60)
GFR, Estimated: 50 mL/min — ABNORMAL LOW (ref >60)
Glucose, Bld: 174 mg/dL — ABNORMAL HIGH (ref 70–99)
Glucose, Bld: 216 mg/dL — ABNORMAL HIGH (ref 70–99)
Glucose, Bld: 155 mg/dL — ABNORMAL HIGH (ref 70–99)
Glucose, Bld: 228 mg/dL — ABNORMAL HIGH (ref 70–99)
Glucose, Bld: 180 mg/dL — ABNORMAL HIGH (ref 70–99)
Potassium: 3.6 mmol/L (ref 3.5–5.1)
Potassium: 3.7 mmol/L (ref 3.5–5.1)
Potassium: 3.9 mmol/L (ref 3.5–5.1)
Potassium: 3.7 mmol/L (ref 3.5–5.1)
Potassium: 3.8 mmol/L (ref 3.5–5.1)
Sodium: 155 mmol/L — ABNORMAL HIGH (ref 135–145)
Sodium: 152 mmol/L — ABNORMAL HIGH (ref 135–145)
Sodium: 151 mmol/L — ABNORMAL HIGH (ref 135–145)
Sodium: 149 mmol/L — ABNORMAL HIGH (ref 135–145)
Sodium: 148 mmol/L — ABNORMAL HIGH (ref 135–145)

## 2023-05-01 LAB — GLUCOSE, CAPILLARY
Glucose-Capillary: 101 mg/dL — ABNORMAL HIGH (ref 70–99)
Glucose-Capillary: 175 mg/dL — ABNORMAL HIGH (ref 70–99)
Glucose-Capillary: 181 mg/dL — ABNORMAL HIGH (ref 70–99)
Glucose-Capillary: 184 mg/dL — ABNORMAL HIGH (ref 70–99)
Glucose-Capillary: 193 mg/dL — ABNORMAL HIGH (ref 70–99)
Glucose-Capillary: 205 mg/dL — ABNORMAL HIGH (ref 70–99)

## 2023-05-01 MED ORDER — PANTOPRAZOLE SODIUM 40 MG PO TBEC
80.0000 mg | DELAYED_RELEASE_TABLET | Freq: Every day | ORAL | Status: DC
  Administered 2023-05-01 – 2023-05-04 (×4): 80 mg via ORAL
  Filled 2023-05-01 (×4): qty 2

## 2023-05-01 MED ORDER — INSULIN GLARGINE-YFGN 100 UNIT/ML ~~LOC~~ SOLN
10.0000 [IU] | Freq: Every day | SUBCUTANEOUS | Status: DC
  Administered 2023-05-01 – 2023-05-04 (×4): 10 [IU] via SUBCUTANEOUS
  Filled 2023-05-01 (×4): qty 0.1

## 2023-05-01 MED ORDER — ATORVASTATIN CALCIUM 10 MG PO TABS
10.0000 mg | ORAL_TABLET | Freq: Every evening | ORAL | Status: DC
  Administered 2023-05-01 – 2023-05-04 (×4): 10 mg via ORAL
  Filled 2023-05-01 (×4): qty 1

## 2023-05-01 MED ORDER — DEXTROSE 5 % IV SOLN: INTRAVENOUS | Status: AC

## 2023-05-01 MED ORDER — INSULIN ASPART 100 UNIT/ML IJ SOLN
0.0000 [IU] | Freq: Every day | INTRAMUSCULAR | Status: DC
  Administered 2023-05-02: 2 [IU] via SUBCUTANEOUS

## 2023-05-01 MED ORDER — SODIUM CHLORIDE 0.9 % IV SOLN
1.0000 g | INTRAVENOUS | Status: DC
  Administered 2023-05-01 – 2023-05-02 (×2): 1 g via INTRAVENOUS
  Filled 2023-05-01 (×2): qty 10

## 2023-05-01 MED ORDER — INSULIN ASPART 100 UNIT/ML IJ SOLN
0.0000 [IU] | Freq: Three times a day (TID) | INTRAMUSCULAR | Status: DC
  Administered 2023-05-01: 5 [IU] via SUBCUTANEOUS
  Administered 2023-05-02: 2 [IU] via SUBCUTANEOUS
  Administered 2023-05-02: 5 [IU] via SUBCUTANEOUS
  Administered 2023-05-02: 3 [IU] via SUBCUTANEOUS
  Administered 2023-05-03: 5 [IU] via SUBCUTANEOUS
  Administered 2023-05-03 (×2): 2 [IU] via SUBCUTANEOUS
  Administered 2023-05-04 (×2): 5 [IU] via SUBCUTANEOUS
  Administered 2023-05-04: 3 [IU] via SUBCUTANEOUS

## 2023-05-01 NOTE — Evaluation (Signed)
Occupational Therapy Evaluation Patient Details Name: Nathaniel Henry MRN: [id number] DOB: 09/06/1932 Today's Date: 05/01/2023   History of Present Illness 89 year old male who presented from [facility] to ED 1/30 with AMS after 2 days of elevated . PMH:T2DM, recurrent urinary tract infections (UTIs),  recently admitted for hypovolemic shock 1/5-04/07/23 and dementia, stage 3 sacral pressure injury   Clinical Impression   Patient residing at [facility] in their memory care unit where notes indicate patient has not been eating or participating in activities recently. Patient appears to have advanced dementia as he is only oriented to his name. Patient is currently a total A for ADL management, and total A of 2 for bed mobility. Patient has a Stage 3 pressure wound on sacrum and therefore unable to maintain sitting balance without significant pain. OT in agreement with PT note and evaluation with regard to air mattress and prevalon boots. Patient with no current acute OT needs, with OT recommending return to [facility] when medically stable. OT will sign off at this time.      If plan is discharge home, recommend the following: Two people to help with walking and/or transfers;A lot of help with bathing/dressing/bathroom;Assistance with feeding;Direct supervision/assist for medications management;Direct supervision/assist for financial management;Assist for transportation;Help with stairs or ramp for entrance;Supervision due to cognitive status    Functional Status Assessment  Patient has had a recent decline in their functional status and demonstrates the ability to make significant improvements in function in a reasonable and predictable amount of time.  Equipment Recommendations  None recommended by OT    Recommendations for Other Services       Precautions / Restrictions Precautions Precautions: Fall Restrictions Weight Bearing Restrictions Per Provider Order: No      Mobility  Bed Mobility     Rolling: Total assist, +2 for physical assistance              Transfers                          Balance   Sitting-balance support: Feet unsupported, No upper extremity supported Sitting balance-Leahy Scale: Zero Sitting balance - Comments: requires +2 assist to maintain sitting EoB, even with LE placed on the floor for stabilization, he immediately lifts them back up                                   ADL either performed or assessed with clinical judgement   ADL Overall ADL's : Needs assistance/impaired Eating/Feeding: Maximal assistance   Grooming: Total assistance;Bed level   Upper Body Bathing: Total assistance;Bed level   Lower Body Bathing: Total assistance;+2 for physical assistance;+2 for safety/equipment;Bed level   Upper Body Dressing : Total assistance;Bed level   Lower Body Dressing: Total assistance;Bed level   Toilet Transfer: Total assistance;+2 for safety/equipment;+2 for physical assistance Toilet Transfer Details (indicate cue type and reason): bed level Toileting- Clothing Manipulation and Hygiene: Total assistance;+2 for physical assistance;+2 for safety/equipment;Bed level       Functional mobility during ADLs: Total assistance;+2 for physical assistance;+2 for safety/equipment General ADL Comments: Patient residing at [facility] in their memory care unit where notes indicate patient has not been eating or participating in activities recently. Patient appears to have advanced dementia as he is only oriented to his name. Patient is currently a total A for ADL management, and total A of 2 for bed  mobility. Patient has a Stage 3 pressure wound on sacrum and therefore unable to maintain sitting balance without significant pain. OT in agreement with PT note and evaluation with regard to air mattress and prevalon boots. Patient with no current acute OT needs, with OT recommending return to [facility] when medically  stable. OT will sign off at this time.     Vision   Additional Comments: could not assess due to mentation     Perception Perception: Not tested       Praxis Praxis: Not tested       Pertinent Vitals/Pain Pain Assessment Pain Assessment: Faces Faces Pain Scale: Hurts even more Pain Location: sacrum with sitting on EoB Pain Descriptors / Indicators: Grimacing, Guarding, Moaning Pain Intervention(s): Limited activity within patient's tolerance, Monitored during session, Repositioned     Extremity/Trunk Assessment Upper Extremity Assessment Upper Extremity Assessment: Generalized weakness   Lower Extremity Assessment Lower Extremity Assessment: Defer to PT evaluation   Cervical / Trunk Assessment Cervical / Trunk Assessment: Kyphotic   Communication Communication Communication: Hearing impairment Cueing Techniques: Verbal cues;Gestural cues;Tactile cues;Visual cues   Cognition Arousal: Lethargic Behavior During Therapy: Restless Overall Cognitive Status: History of cognitive impairments - at baseline                                 General Comments: Patient with dementia at baseline, did not follow commands, would alert to his name and music, would reply "yeah" to all questions, could not sit at the EOB due to significant pain in sacrum, frequently attempting to offload     General Comments  sacral wound impairing his ability to tolerate sitting, have asked for order for Prevalon Boots and Air Mattress to decrease risk of progression of sacral pressure injury and occurance of heel pressure injuries    Exercises     Shoulder Instructions      Home Living                                   Additional Comments: return to SNF      Prior Functioning/Environment                          OT Problem List: Decreased strength;Decreased range of motion;Decreased activity tolerance;Impaired balance (sitting and/or  standing);Decreased coordination;Decreased cognition;Decreased safety awareness;Decreased knowledge of use of DME or AE;Pain      OT Treatment/Interventions:      OT Goals(Current goals can be found in the care plan section) Acute Rehab OT Goals Patient Stated Goal: unable OT Goal Formulation: Patient unable to participate in goal setting Time For Goal Achievement: 05/15/23 Potential to Achieve Goals: Poor  OT Frequency:      Co-evaluation              AM-PAC OT "6 Clicks" Daily Activity     Outcome Measure Help from another person eating meals?: Total Help from another person taking care of personal grooming?: Total Help from another person toileting, which includes using toliet, bedpan, or urinal?: Total Help from another person bathing (including washing, rinsing, drying)?: Total Help from another person to put on and taking off regular upper body clothing?: Total Help from another person to put on and taking off regular lower body clothing?: Total 6 Click Score: 6   End of Session Nurse Communication: Mobility  status  Activity Tolerance: Patient limited by fatigue;Patient limited by lethargy Patient left: in bed;with bed alarm set;with call bell/phone within reach  OT Visit Diagnosis: Unsteadiness on feet (R26.81);Other abnormalities of gait and mobility (R26.89);Muscle weakness (generalized) (M62.81)                Time: 1610-9604 OT Time Calculation (min): 14 min Charges:  OT General Charges $OT Visit: 1 Visit OT Evaluation $OT Eval Moderate Complexity: 1 Mod  Pollyann Glen E. Ethylene Reznick, OTR/L Acute Rehabilitation Services [phone]   Cherlyn Cushing 05/01/2023, 12:45 PM
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
## 2023-05-01 NOTE — Progress Notes (Addendum)
HD#1 Subjective:  Overnight Events: No acute event overnight. Patient's wife, daughter and son-in-law visited pt.  Mental status had improved, was alert and oriented to self, but not place or time  This morning, patient was examined at the bedside.  He was awake, comfortably eating breakfast. He worked with PT/OT/SLP.  Objective:  Vital signs in last 24 hours: Vitals:   05/01/23 0035 05/01/23 0427 05/01/23 0727 05/01/23 1203  BP: (!) 155/90 (!) 144/86 (!) 152/84 130/81  Pulse: 91 96 90 (!) 102  Resp: 18 18 16 16   Temp: 97.7 F (36.5 C) 98 F (36.7 C) (!) 97.5 F (36.4 C) (!) 97.5 F (36.4 C)  TempSrc: Oral Oral Oral Oral  SpO2: 100% 100% 100% 100%  Weight:      Height:       Supplemental O2: Room Air SpO2: 100 %   Physical Exam:   General: NAD HEENT: Moist mucous membranes. CV: Regular rate and rhythm, no murmurs. Pulmonary: Lungs CTAB. Normal effort. No wheezing or rales. Abdominal: Soft, nontender, nondistended. Normal bowel sounds. Skin: stage 3 pressure ulcer, without erythema or drainage. Neuro: Alert and oriented to self, but not place or time.  Filed Weights   04/30/23 1008  Weight: 78 kg     Intake/Output Summary (Last 24 hours) at 05/01/2023 1318 Last data filed at 05/01/2023 0903 Gross per 24 hour  Intake --  Output 550 ml  Net -550 ml   Net IO Since Admission: -550 mL [05/01/23 1318]  Recent Labs    05/01/23 0401 05/01/23 0734 05/01/23 1205  GLUCAP 181* 193* 184*     Pertinent Labs:    Latest Ref Rng & Units 05/01/2023    6:52 AM 04/30/2023    4:01 PM 04/30/2023   10:45 AM  CBC  WBC 4.0 - 10.5 K/uL 14.0     Hemoglobin 13.0 - 17.0 g/dL 40.9  81.1  91.4   Hematocrit 39.0 - 52.0 % 40.5  38.0  42.0   Platelets 150 - 400 K/uL 281          Latest Ref Rng & Units 05/01/2023   11:32 AM 05/01/2023    6:52 AM 04/30/2023   11:09 PM  CMP  Glucose 70 - 99 mg/dL 782  956  213   BUN 8 - 23 mg/dL 45  48  53   Creatinine 0.61 - 1.24 mg/dL  0.86  5.78  4.69   Sodium 135 - 145 mmol/L 151  152  155   Potassium 3.5 - 5.1 mmol/L 3.9  3.7  3.6   Chloride 98 - 111 mmol/L 114  116  119   CO2 22 - 32 mmol/L 23  25  23    Calcium 8.9 - 10.3 mg/dL 62.9  52.8  41.3     Imaging: No results found.  Assessment/Plan:   Principal Problem:   Acute encephalopathy Active Problems:   Diabetes mellitus, type 2 (HCC)   AKI (acute kidney injury) (HCC)   UTI (urinary tract infection)   Dementia (HCC)   Hyperglycemia   Hypernatremia   CKD (chronic kidney disease) stage 3, GFR 30-59 ml/min (HCC)   BPH (benign prostatic hyperplasia)   Advanced age   Patient Summary: Nathaniel Henry is a 89 y.o.  with a hx of baseline dementia, recurrent UTI, diabetes who presented to the ED with altered mental status and admitted for acute encephalopathy.    Active Problems:   * No active hospital problems. *   #  Acute encephalopathy, resolved  # Hypernatremia # Hyperglycemia  # Sepsis 2/2 UTI  Mental status has improved, today he is awake, alert and oriented to self ( baseline)  but not place or time.  On labs, leukocytosis improved 20.2---> 14.0, and blood sugars have improved with IV fluids, and sliding scale insulin.  UA positive for leukocyte esterase, glucosuria, and rare bacteria, currently treated with IV rocephin . Still hypernatremic, corrected Na 153.  Will increase the rate of free water repletion with D5W.  Patient's acute AMS was secondary to elevated glucose, hyponatremia and acute urinary tract infection. Patient was seen evaluated by PT and OT, recommending Prevalon boots and air mattress given patient's stage III pressure wound on sacrum. SLP recommends dysphagia 1 diet for patient, given his high risk for aspiration  - Serial BMP for sodium, cont. D5W IVFs - Follow up urine culture. - Continue with Rocephin for UTI. - Prevalon boots and air mattress for sacral ulcer.   # AKI on CKD 3A Scr 1.67---> 1.29 , improved with IV fluids.  Baseline SCr 1.25-1.35. suspect pre-renal etiology from decreased po intake.   - BMP - Encourage po hydration. - Avoid nephrotoxic drug.  # Type 2 Diabetes with hyperglycemia # CKD  Blood glucose have improved with sliding scale insulin.  He is currently not taking any medicine for his diabetes, previously on metformin and glipizide. Now the patient is eating, we can resume long-acting insulin, and resume renally dosed metformin at discharge.  - Start semglee 10 units daily  - change SSI q4h to TID AC + HS - resume renally dosed metformin at discharge    # UTI # Hx of recurrent UTI's. # BPH  Receiving treatment for UTI as above. - Cont rocephin  - Strict I&Os ; bladder scan if concern for retention with BPH  -Follow-up urine culture.   # Hypothyroidism TSH normal. Continued with synthroid 75 mcg.     Diet: Regular diet IVF: @Meds ; iv fluids:31617@, @Iv  rate:31029@ VTE: enoxaparin (LOVENOX) injection 40 mg Start: 04/30/23 1430 Code: DNR/DNI PT/OT: SNF for Subacute PT ID:  Anti-infectives (From admission, onward)    Start     Dose/Rate Route Frequency Ordered Stop   05/01/23 0800  cefTRIAXone (ROCEPHIN) 1 g in sodium chloride 0.9 % 100 mL IVPB        1 g 200 mL/hr over 30 Minutes Intravenous Every 24 hours 05/01/23 0208     04/30/23 1315  vancomycin (VANCOREADY) IVPB 500 mg/100 mL  Status:  Discontinued        500 mg 100 mL/hr over 60 Minutes Intravenous  Once 04/30/23 1305 05/01/23 1037   04/30/23 1115  ceFEPIme (MAXIPIME) 2 g in sodium chloride 0.9 % 100 mL IVPB        2 g 200 mL/hr over 30 Minutes Intravenous  Once 04/30/23 1111 04/30/23 1300   04/30/23 1115  metroNIDAZOLE (FLAGYL) IVPB 500 mg        500 mg 100 mL/hr over 60 Minutes Intravenous  Once 04/30/23 1111 04/30/23 1300   04/30/23 1115  vancomycin (VANCOCIN) IVPB 1000 mg/200 mL premix        1,000 mg 200 mL/hr over 60 Minutes Intravenous  Once 04/30/23 1111 04/30/23 1300        Anticipated discharge  to pending medical stabilization.  Laretta Bolster, MD 05/01/2023, 1:18 PM Pager: [phone] [facility]  Please contact the on call pager after 5 pm and on weekends at [phone].

## 2023-05-01 NOTE — Evaluation (Signed)
Physical Therapy Brief Evaluation and Discharge Note Patient Details Name: Nathaniel Henry MRN: [id number] DOB: 1932/08/08 Today's Date: 05/01/2023   History of Present Illness  89 year old male who presented from [facility] to ED 1/30 with AMS after 2 days of elevated . PMH:T2DM, recurrent urinary tract infections (UTIs),  recently admitted for hypovolemic shock 1/5-04/07/23 and dementia, stage 3 sacral pressure injury  Clinical Impression  Pt is from [facility] where per prior notes he was not eating or participating much. Pt with increased dementia and is currently only oriented to self. Pt does not follow commands and is driven mainly but painful stimulus of sitting on his sacral wound when sitting on EoB. PT recommending pt be placed on air mattress with prevalon boots to reduce evolution of pressure injuries. Pt is likely at his baseline level of function and does not need any further PT either acutely or postacutely. PT recommending pt return to SNF when medically stable.        PT Assessment All further PT needs can be met in the next venue of care  Assistance Needed at Discharge  Frequent or constant Supervision/Assistance    Equipment Recommendations None recommended by PT     Precautions/Restrictions Precautions Precautions: Fall Restrictions Weight Bearing Restrictions Per Provider Order: No        Mobility  Bed Mobility Rolling: Total assist, +2 for physical assistance Supine/Sidelying to sit: Total assist, 2 for physical assistance Sit to supine/sidelying: Total assist, 2 for physical assistance General bed mobility comments: total Ax2 to come to EoB, does not tolerate due to sacral wound. totalA for return to supine         Balance Overall balance assessment: Needs assistance Sitting-balance support: Feet unsupported, No upper extremity supported Sitting balance-Leahy Scale: Zero Sitting balance - Comments: requires +2 assist to maintain sitting EoB,  even with LE placed on the floor for stabilization, he immediately lifts them back up                  Pertinent Vitals/Pain PT - Brief Vital Signs All Vital Signs Stable: Yes Pain Assessment Pain Assessment: Faces Faces Pain Scale: Hurts even more Pain Location: sacrum with sitting on EoB Pain Descriptors / Indicators: Grimacing, Guarding, Moaning Pain Intervention(s): Limited activity within patient's tolerance, Monitored during session, Repositioned     Home Living Family/patient expects to be discharged to:: Skilled nursing facility             Additional Comments: return to SNF    Prior Function Level of Independence: Needs assistance Comments: pt was total care at SNF    UE/LE Assessment   UE ROM/Strength/Tone/Coordination: Generalized weakness    LE ROM/Strength/Tone/Coordination: Generalized weakness      Communication   Communication Communication: Hearing impairment Cueing Techniques: Verbal cues;Gestural cues;Tactile cues;Visual cues     Cognition Overall Cognitive Status: History of cognitive impairments - at baseline Comments: dementia at baseline able to provide name and date of birth otherwise disoriented.     General Comments General comments (skin integrity, edema, etc.): sacral wound impairing his ability to tolerate sitting, have asked for order for Prevalon Boots and Air Mattress to decrease risk of progression of sacral pressure injury and occurance of heel pressure injuries        Assessment/Plan    PT Problem List Decreased strength;Decreased skin integrity;Decreased balance;Decreased cognition       PT Visit Diagnosis Adult, failure to thrive (R62.7)    No Skilled PT  AMPAC 6 Clicks Help needed turning from your back to your side while in a flat bed without using bedrails?: Total Help needed moving from lying on your back to sitting on the side of a flat bed without using bedrails?: Total Help needed moving to and  from a bed to a chair (including a wheelchair)?: Total Help needed standing up from a chair using your arms (e.g., wheelchair or bedside chair)?: Total Help needed to walk in hospital room?: Total Help needed climbing 3-5 steps with a railing? : Total 6 Click Score: 6      End of Session   Activity Tolerance: Patient limited by pain Patient left: in bed;with call bell/phone within reach;Other (comment) (pillow placed under R hip) Nurse Communication: Mobility status;Other (comment) (need for air mattress and Prevalon Boots) PT Visit Diagnosis: Adult, failure to thrive (R62.7)     Time: 1478-2956 PT Time Calculation (min) (ACUTE ONLY): 14 min  Charges:   PT Evaluation $PT Eval Low Complexity: 1 Low      Jennifer Holland B. Beverely Risen PT, DPT Acute Rehabilitation Services Please use secure chat or  Call Office [phone]   Elon Alas [facility]  05/01/2023, 10:46 AM

## 2023-05-02 DIAGNOSIS — G9340 Encephalopathy, unspecified: Secondary | ICD-10-CM | POA: Diagnosis not present

## 2023-05-02 LAB — GLUCOSE, CAPILLARY
Glucose-Capillary: 190 mg/dL — ABNORMAL HIGH (ref 70–99)
Glucose-Capillary: 144 mg/dL — ABNORMAL HIGH (ref 70–99)
Glucose-Capillary: 204 mg/dL — ABNORMAL HIGH (ref 70–99)
Glucose-Capillary: 237 mg/dL — ABNORMAL HIGH (ref 70–99)

## 2023-05-02 LAB — CBC
HCT: 36.9 % — ABNORMAL LOW (ref 39.0–52.0)
Hemoglobin: 11.7 g/dL — ABNORMAL LOW (ref 13.0–17.0)
MCH: 28.7 pg (ref 26.0–34.0)
MCHC: 31.7 g/dL (ref 30.0–36.0)
MCV: 90.7 fL (ref 80.0–100.0)
Platelets: 285 10*3/uL (ref 150–400)
RBC: 4.07 MIL/uL — ABNORMAL LOW (ref 4.22–5.81)
RDW: 16 % — ABNORMAL HIGH (ref 11.5–15.5)
WBC: 12.3 10*3/uL — ABNORMAL HIGH (ref 4.0–10.5)
nRBC: 0 % (ref 0.0–0.2)

## 2023-05-02 LAB — BASIC METABOLIC PANEL
Anion gap: 13 (ref 5–15)
BUN: 38 mg/dL — ABNORMAL HIGH (ref 8–23)
CO2: 25 mmol/L (ref 22–32)
Calcium: 10 mg/dL (ref 8.9–10.3)
Chloride: 108 mmol/L (ref 98–111)
Creatinine, Ser: 1.42 mg/dL — ABNORMAL HIGH (ref 0.61–1.24)
GFR, Estimated: 48 mL/min — ABNORMAL LOW (ref >60)
Glucose, Bld: 203 mg/dL — ABNORMAL HIGH (ref 70–99)
Potassium: 3.7 mmol/L (ref 3.5–5.1)
Sodium: 146 mmol/L — ABNORMAL HIGH (ref 135–145)

## 2023-05-02 MED ORDER — ENSURE ENLIVE PO LIQD
237.0000 mL | Freq: Two times a day (BID) | ORAL | Status: DC
  Administered 2023-05-02 – 2023-05-04 (×4): 237 mL via ORAL

## 2023-05-02 MED ORDER — SODIUM CHLORIDE 0.9 % IV SOLN
2.0000 g | INTRAVENOUS | Status: DC
  Administered 2023-05-02 – 2023-05-03 (×2): 2 g via INTRAVENOUS
  Filled 2023-05-02 (×2): qty 12.5

## 2023-05-02 NOTE — Progress Notes (Signed)
Pharmacy Antibiotic Note  Nathaniel Henry is a 89 y.o. male admitted on 04/30/2023 with UTI.  Pharmacy has been consulted for cefepime dosing.  Currently on ceftriaxone, urine culture grew pseudomonas, sensitivities pending. Per primary team will expand coverage to cefepime. Scr stable around 1.4, CrCl 35 mL/min. WBC has been downtrending, now at 12.3. Patient is afebrile. External urinary catheter in place.   Plan: Stop ceftriaxone Start cefepime 2g IV q24h Monitor renal function, clinical status, and culture sensitivities  Height: 5\' 6"  (167.6 cm) Weight: 78 kg (171 lb 15.3 oz) IBW/kg (Calculated) : 63.8  Temp (24hrs), Avg:98.1 F (36.7 C), Min:97.5 F (36.4 C), Max:98.5 F (36.9 C)  Recent Labs  Lab 04/30/23 1002 04/30/23 1046 04/30/23 1601 04/30/23 1830 04/30/23 2309 05/01/23 0652 05/01/23 1132 05/01/23 1812 05/01/23 2242 05/02/23 0633  WBC 20.2*  --   --   --   --  14.0*  --   --   --  12.3*  CREATININE 1.67*  --    < > 1.57*   < > 1.36* 1.29* 1.43* 1.37* 1.42*  LATICACIDVEN  --  3.2*  --  1.8  --   --   --   --   --   --    < > = values in this interval not displayed.    Estimated Creatinine Clearance: 35.3 mL/min (A) (by C-G formula based on SCr of 1.42 mg/dL (H)).    No Known Allergies  Antimicrobials this admission: ceftriaxone 1/31 >> 2/1 cefepime 2/1 >>   Microbiology results: 1/30 BCx: ngtd x2 1/30 UCx: 60K pseudomonas    Thank you for allowing pharmacy to be a part of this patient's care.  Romie Minus, PharmD PGY1 Pharmacy Resident  Please check AMION for all [facility] Pharmacy phone numbers After 10:00 PM, call Main Pharmacy [phone] 05/02/2023 12:53 PM

## 2023-05-02 NOTE — Plan of Care (Signed)
No acute changes. Cardiac monitor Dc'd. Awaiting placement.   Problem: Activity: Goal: Risk for activity intolerance will decrease Outcome: Not Met (add Reason)   Problem: Nutrition: Goal: Adequate nutrition will be maintained Outcome: Not Met (add Reason)   Problem: Safety: Goal: Ability to remain free from injury will improve Outcome: Not Met (add Reason)   Problem: Pain Managment: Goal: General experience of comfort will improve and/or be controlled Outcome: Not Met (add Reason)   Problem: Fluid Volume: Goal: Ability to maintain a balanced intake and output will improve Outcome: Not Met (add Reason)   Problem: Nutritional: Goal: Maintenance of adequate nutrition will improve Outcome: Not Met (add Reason) Goal: Progress toward achieving an optimal weight will improve Outcome: Not Met (add Reason)   Problem: Skin Integrity: Goal: Risk for impaired skin integrity will decrease Outcome: Not Met (add Reason)   Problem: Tissue Perfusion: Goal: Adequacy of tissue perfusion will improve Outcome: Not Met (add Reason)

## 2023-05-02 NOTE — Progress Notes (Signed)
HD#2 Subjective:  Nathaniel Henry is a 89 y.o.  with a hx of baseline dementia, recurrent UTI, diabetes who presented to the ED with altered mental status and admitted for acute encephalopathy.   Overnight Events: No acute event overnight.  Patient evaluated bedside.  Alert and oriented to self. Denies any pain or discomfort. Ate dinner last night. No acute concerns this morning.   Objective:  Vital signs in last 24 hours: Vitals:   05/01/23 1555 05/01/23 1943 05/01/23 2358 05/02/23 0403  BP: 129/78 131/69 (!) 107/59 (!) 139/97  Pulse: 87 100 86 75  Resp: 17 18 18 18   Temp: 98.2 F (36.8 C) 98.3 F (36.8 C) 98.5 F (36.9 C) (!) 97.5 F (36.4 C)  TempSrc: Oral Oral Oral Oral  SpO2: 92% 100% 95% 100%  Weight:      Height:       Supplemental O2: Room Air SpO2: 100 %   Physical Exam:   General: alert, sitting up in bed comfortably, in no acute distress CV: RRR Pulmonary: Normal work of breathing on RA Abdominal: Soft, nontender, nondistended. Normal bowel sounds. Skin: Noted on prior exam: stage 3 pressure ulcer, without erythema or drainage Neuro: Alert and oriented to self, but not place or time.  Filed Weights   04/30/23 1008  Weight: 78 kg     Intake/Output Summary (Last 24 hours) at 05/02/2023 0551 Last data filed at 05/02/2023 0428 Gross per 24 hour  Intake 238.46 ml  Output 850 ml  Net -611.54 ml   Net IO Since Admission: -811.54 mL [05/02/23 0551]  Recent Labs    05/01/23 1205 05/01/23 1555 05/01/23 2109  GLUCAP 184* 205* 175*     Pertinent Labs:    Latest Ref Rng & Units 05/01/2023    6:52 AM 04/30/2023    4:01 PM 04/30/2023   10:45 AM  CBC  WBC 4.0 - 10.5 K/uL 14.0     Hemoglobin 13.0 - 17.0 g/dL 16.1  09.6  04.5   Hematocrit 39.0 - 52.0 % 40.5  38.0  42.0   Platelets 150 - 400 K/uL 281          Latest Ref Rng & Units 05/01/2023   10:42 PM 05/01/2023    6:12 PM 05/01/2023   11:32 AM  CMP  Glucose 70 - 99 mg/dL 409  811  914   BUN 8 -  23 mg/dL 43  44  45   Creatinine 0.61 - 1.24 mg/dL 7.82  9.56  2.13   Sodium 135 - 145 mmol/L 148  149  151   Potassium 3.5 - 5.1 mmol/L 3.8  3.7  3.9   Chloride 98 - 111 mmol/L 113  113  114   CO2 22 - 32 mmol/L 24  24  23    Calcium 8.9 - 10.3 mg/dL 08.6  9.8  57.8     Imaging: No results found.  Assessment/Plan:   Principal Problem:   Acute encephalopathy Active Problems:   Diabetes mellitus, type 2 (HCC)   AKI (acute kidney injury) (HCC)   UTI (urinary tract infection)   Dementia (HCC)   Hyperglycemia   Hypernatremia   CKD (chronic kidney disease) stage 3, GFR 30-59 ml/min (HCC)   BPH (benign prostatic hyperplasia)   Advanced age   Patient Summary: Nathaniel Henry is a 89 y.o.  with a hx of baseline dementia, recurrent UTI, diabetes who presented to the ED with altered mental status and admitted for acute encephalopathy.     #  Acute encephalopathy, resolved  # Hypernatremia # Hyperglycemia  # Sepsis 2/2 UTI  Improved/stable from yesterday. A&Ox1. Afebrile and leukocytosis improved to 12.3. Sodium trended down to 146 after D5W overnight and CBG improved to 155-200. On CTX for UTI, pending Ucx results, prelim showing GNR. Hx of Klebsiella UTI.  -Trend BMP -Encourage po intake -CBG monitoring -Semglee 10 units daily and SSI-M (now off D5W) -Continue CTX (day 3), tailor based on Ucx  -F/u Urine culture  -Anticipate d/c to return to SNF once hypernatremia and hyperglycemia stabilizes   # AKI on CKD 3A, resolved Scr 1.67>>1.3-1.4 , improved with IV fluids. Baseline SCr 1.25-1.35. Suspect pre-renal etiology from decreased po intake.  -Trend BMP -Encourage po hydration -Avoid nephrotoxic drug.  # Type 2 Diabetes with hyperglycemia CBG improved with long acting and SSI. Fasting CBG 190, also was on D5W overnight. Likely d/c with long acting insulin. Can consider restart metformin if able to tolerate but noted loose stools last admission.   -CBG monitoring  ACHS -Continue Semglee 10 units daily -Continue SSI-M -Consider metformin if tolerated    # UTI # Hx of recurrent UTI # BPH  UA suggestive of UTI. Recently treated for Klebsiella UTI.  -Continue CTX (day 3), tailor based on Ucx    # Hypothyroidism TSH normal. Continued with synthroid 75 mcg.   # Sacral Pressure Ulcer PTA. Wound care in place. Air mattress in place.   Diet: Dysphagia 1 IVF:  D5W VTE: enoxaparin (LOVENOX) injection 40 mg Start: 04/30/23 1430 Code: DNR/DNI PT/OT: SNF for Subacute PT ID:  Anti-infectives (From admission, onward)    Start     Dose/Rate Route Frequency Ordered Stop   05/01/23 0800  cefTRIAXone (ROCEPHIN) 1 g in sodium chloride 0.9 % 100 mL IVPB        1 g 200 mL/hr over 30 Minutes Intravenous Every 24 hours 05/01/23 0208     04/30/23 1315  vancomycin (VANCOREADY) IVPB 500 mg/100 mL  Status:  Discontinued        500 mg 100 mL/hr over 60 Minutes Intravenous  Once 04/30/23 1305 05/01/23 1037   04/30/23 1115  ceFEPIme (MAXIPIME) 2 g in sodium chloride 0.9 % 100 mL IVPB        2 g 200 mL/hr over 30 Minutes Intravenous  Once 04/30/23 1111 04/30/23 1300   04/30/23 1115  metroNIDAZOLE (FLAGYL) IVPB 500 mg        500 mg 100 mL/hr over 60 Minutes Intravenous  Once 04/30/23 1111 04/30/23 1300   04/30/23 1115  vancomycin (VANCOCIN) IVPB 1000 mg/200 mL premix        1,000 mg 200 mL/hr over 60 Minutes Intravenous  Once 04/30/23 1111 04/30/23 1300      Family Update: called son yesterday  Dispo: Anticipated discharge to return to SNF in 1 days pending medical stabilization.  Rana Snare, DO 05/02/2023, 5:51 AM Pager: [phone] [facility]  Please contact the on call pager after 5 pm and on weekends at [phone].

## 2023-05-02 NOTE — Progress Notes (Signed)
Preliminary urine cultures grew Pseudomonas aeruginosa. Will stop CTX and start Cefepime today pending susceptibilities.

## 2023-05-03 LAB — BASIC METABOLIC PANEL
Anion gap: 11 (ref 5–15)
BUN: 35 mg/dL — ABNORMAL HIGH (ref 8–23)
CO2: 24 mmol/L (ref 22–32)
Calcium: 9.8 mg/dL (ref 8.9–10.3)
Chloride: 108 mmol/L (ref 98–111)
Creatinine, Ser: 1.34 mg/dL — ABNORMAL HIGH (ref 0.61–1.24)
GFR, Estimated: 51 mL/min — ABNORMAL LOW (ref >60)
Glucose, Bld: 176 mg/dL — ABNORMAL HIGH (ref 70–99)
Potassium: 3.8 mmol/L (ref 3.5–5.1)
Sodium: 143 mmol/L (ref 135–145)

## 2023-05-03 LAB — CBC
HCT: 37.3 % — ABNORMAL LOW (ref 39.0–52.0)
Hemoglobin: 11.8 g/dL — ABNORMAL LOW (ref 13.0–17.0)
MCH: 28.6 pg (ref 26.0–34.0)
MCHC: 31.6 g/dL (ref 30.0–36.0)
MCV: 90.3 fL (ref 80.0–100.0)
Platelets: 288 10*3/uL (ref 150–400)
RBC: 4.13 MIL/uL — ABNORMAL LOW (ref 4.22–5.81)
RDW: 15.8 % — ABNORMAL HIGH (ref 11.5–15.5)
WBC: 9.1 10*3/uL (ref 4.0–10.5)
nRBC: 0 % (ref 0.0–0.2)

## 2023-05-03 LAB — URINE CULTURE: Culture: 60000 — AB

## 2023-05-03 LAB — GLUCOSE, CAPILLARY
Glucose-Capillary: 169 mg/dL — ABNORMAL HIGH (ref 70–99)
Glucose-Capillary: 219 mg/dL — ABNORMAL HIGH (ref 70–99)
Glucose-Capillary: 135 mg/dL — ABNORMAL HIGH (ref 70–99)
Glucose-Capillary: 148 mg/dL — ABNORMAL HIGH (ref 70–99)

## 2023-05-03 MED ORDER — SENNOSIDES-DOCUSATE SODIUM 8.6-50 MG PO TABS: 1.0000 | ORAL_TABLET | Freq: Every evening | ORAL | Status: DC | PRN

## 2023-05-03 MED ORDER — SENNOSIDES-DOCUSATE SODIUM 8.6-50 MG PO TABS
2.0000 | ORAL_TABLET | Freq: Once | ORAL | Status: AC
Start: 2023-05-03 — End: 2023-05-03
  Administered 2023-05-03: 2 via ORAL
  Filled 2023-05-03: qty 2

## 2023-05-03 NOTE — Plan of Care (Addendum)
BM regimen started for constipation. Awaiting placement.   Problem: Education: Goal: Knowledge of General Education information will improve Description: Including pain rating scale, medication(s)/side effects and non-pharmacologic comfort measures Outcome: Not Met (add Reason)   Problem: Activity: Goal: Risk for activity intolerance will decrease Outcome: Not Met (add Reason)   Problem: Elimination: Goal: Will not experience complications related to bowel motility Outcome: Not Met (add Reason) Goal: Will not experience complications related to urinary retention Outcome: Not Met (add Reason)   Problem: Pain Managment: Goal: General experience of comfort will improve and/or be controlled Outcome: Not Met (add Reason)   Problem: Safety: Goal: Ability to remain free from injury will improve Outcome: Not Met (add Reason)   Problem: Skin Integrity: Goal: Risk for impaired skin integrity will decrease Outcome: Not Met (add Reason)

## 2023-05-03 NOTE — Progress Notes (Addendum)
HD#3 Subjective:  Nathaniel Henry is a 89 y.o.  with a hx of baseline dementia, recurrent UTI, diabetes who presented to the ED with altered mental status and admitted for acute encephalopathy.   Overnight Events: No acute event overnight.  Patient evaluated at bedside. Alert and oriented to self. Denies any pain or acute concerns. Did not eat breakfast. Did drink his Ensure which he likes. Family saw him last night.   Objective:  Vital signs in last 24 hours: Vitals:   05/02/23 1702 05/02/23 2007 05/02/23 2334 05/03/23 0416  BP: 107/73 116/73 (!) 153/75 (!) 165/92  Pulse: 87 93 88 84  Resp:  18 18   Temp: 98.3 F (36.8 C) 99.5 F (37.5 C) 98.7 F (37.1 C) 98.6 F (37 C)  TempSrc: Oral Oral Oral Axillary  SpO2: 99% 98% 96% 100%  Weight:      Height:       Supplemental O2: Room Air SpO2: 100 %   Physical Exam:  General: alert, laying in bed comfortably, in no acute distress CV: RRR Pulmonary: Normal work of breathing on RA Abdominal: Soft, nontender, nondistended. Normal bowel sounds. Skin: Noted on prior exam: stage 3 pressure ulcer, without erythema or drainage Neuro: Alert and oriented to self, but not place or time.  Filed Weights   04/30/23 1008  Weight: 78 kg     Intake/Output Summary (Last 24 hours) at 05/03/2023 0556 Last data filed at 05/02/2023 1700 Gross per 24 hour  Intake 280 ml  Output 300 ml  Net -20 ml   Net IO Since Admission: -831.54 mL [05/03/23 0556]  Recent Labs    05/02/23 1111 05/02/23 1703 05/02/23 2108  GLUCAP 144* 204* 237*     Pertinent Labs:    Latest Ref Rng & Units 05/02/2023    6:33 AM 05/01/2023    6:52 AM 04/30/2023    4:01 PM  CBC  WBC 4.0 - 10.5 K/uL 12.3  14.0    Hemoglobin 13.0 - 17.0 g/dL 08.6  57.8  46.9   Hematocrit 39.0 - 52.0 % 36.9  40.5  38.0   Platelets 150 - 400 K/uL 285  281         Latest Ref Rng & Units 05/02/2023    6:33 AM 05/01/2023   10:42 PM 05/01/2023    6:12 PM  CMP  Glucose 70 - 99 mg/dL  629  528  413   BUN 8 - 23 mg/dL 38  43  44   Creatinine 0.61 - 1.24 mg/dL 2.44  0.10  2.72   Sodium 135 - 145 mmol/L 146  148  149   Potassium 3.5 - 5.1 mmol/L 3.7  3.8  3.7   Chloride 98 - 111 mmol/L 108  113  113   CO2 22 - 32 mmol/L 25  24  24    Calcium 8.9 - 10.3 mg/dL 53.6  64.4  9.8     Imaging: No results found.  Assessment/Plan:   Principal Problem:   Acute encephalopathy Active Problems:   Diabetes mellitus, type 2 (HCC)   AKI (acute kidney injury) (HCC)   UTI (urinary tract infection)   Dementia (HCC)   Hyperglycemia   Hypernatremia   CKD (chronic kidney disease) stage 3, GFR 30-59 ml/min (HCC)   BPH (benign prostatic hyperplasia)   Advanced age   Patient Summary: Nathaniel Henry is a 89 y.o.  with a hx of baseline dementia, recurrent UTI, diabetes who presented to the ED with  altered mental status and admitted for acute encephalopathy.     # Acute encephalopathy, resolved  # Hypernatremia # Hyperglycemia  # Sepsis 2/2 UTI  Improved/stable from yesterday. A&Ox1. Afebrile and leukocytosis resolved today. Sodium normalized to 143 s/p D5W. AM CBG improved to 169. Ucx grew pseudomonas, CTX switched to cefepime yesterday.  -Trend BMP -Encourage po intake -CBG monitoring with insulin regimen in place -Cefepime for UTI  -Ucx: pseudomonas, pend susceptibilities  -Bcx: NGTD -Anticipate d/c to return to SNF once susceptibilities Ucx   # AKI on CKD 3A, resolved Scr 1.67>>1.34, GFR 51, BUN improved, s/p IV fluids. Baseline SCr 1.25-1.35. Suspect pre-renal etiology from decreased po intake.  -Trend BMP -Encourage po hydration -Avoid nephrotoxic drug  # Type 2 Diabetes with hyperglycemia CBG improved with long acting and SSI. Fasting CBG 169. Likely d/c with long acting insulin. Can consider restart metformin if able to tolerate but noted loose stools last admission.   -CBG monitoring ACHS -Continue Semglee 10 units daily -Continue SSI-M -Consider metformin if  tolerated    # UTI, Pseudomonas aeruginosa  # Hx of recurrent UTI # BPH  Urine culture growing pseudomonas. Pending susceptibilities.  -Continue Cefepime (day 2), will transition to oral abx pend susceptibilities   Chronic Conditions: # Hypothyroidism: TSH normal. Continued with synthroid 75 mcg. # Sacral Pressure Ulcer: PTA. Wound care in place. Air mattress in place.   Diet: Dysphagia 1 IVF:  none VTE: enoxaparin (LOVENOX) injection 40 mg Start: 04/30/23 1430 Code: DNR/DNI PT/OT: SNF for Subacute PT ID:  Anti-infectives (From admission, onward)    Start     Dose/Rate Route Frequency Ordered Stop   05/02/23 1400  ceFEPIme (MAXIPIME) 2 g in sodium chloride 0.9 % 100 mL IVPB        2 g 200 mL/hr over 30 Minutes Intravenous Every 24 hours 05/02/23 1302     05/01/23 0800  cefTRIAXone (ROCEPHIN) 1 g in sodium chloride 0.9 % 100 mL IVPB  Status:  Discontinued        1 g 200 mL/hr over 30 Minutes Intravenous Every 24 hours 05/01/23 0208 05/02/23 1234   04/30/23 1315  vancomycin (VANCOREADY) IVPB 500 mg/100 mL  Status:  Discontinued        500 mg 100 mL/hr over 60 Minutes Intravenous  Once 04/30/23 1305 05/01/23 1037   04/30/23 1115  ceFEPIme (MAXIPIME) 2 g in sodium chloride 0.9 % 100 mL IVPB        2 g 200 mL/hr over 30 Minutes Intravenous  Once 04/30/23 1111 04/30/23 1300   04/30/23 1115  metroNIDAZOLE (FLAGYL) IVPB 500 mg        500 mg 100 mL/hr over 60 Minutes Intravenous  Once 04/30/23 1111 04/30/23 1300   04/30/23 1115  vancomycin (VANCOCIN) IVPB 1000 mg/200 mL premix        1,000 mg 200 mL/hr over 60 Minutes Intravenous  Once 04/30/23 1111 04/30/23 1300      Family Update: called/updated son and daughter-in-law yesterday   Dispo: Anticipated discharge to return to SNF in 1 days pending Urine culture susceptibilities to transition to oral abx.   Rana Snare, DO 05/03/2023, 5:56 AM Pager: [phone] [facility]  Please contact the on call  pager after 5 pm and on weekends at [phone].

## 2023-05-04 ENCOUNTER — Inpatient Hospital Stay (HOSPITAL_COMMUNITY): Payer: Medicare Other

## 2023-05-04 DIAGNOSIS — G9340 Encephalopathy, unspecified: Secondary | ICD-10-CM | POA: Diagnosis not present

## 2023-05-04 LAB — GLUCOSE, CAPILLARY
Glucose-Capillary: 159 mg/dL — ABNORMAL HIGH (ref 70–99)
Glucose-Capillary: 203 mg/dL — ABNORMAL HIGH (ref 70–99)
Glucose-Capillary: 243 mg/dL — ABNORMAL HIGH (ref 70–99)

## 2023-05-04 MED ORDER — LEVOFLOXACIN 750 MG PO TABS
750.0000 mg | ORAL_TABLET | ORAL | Status: DC
  Administered 2023-05-04: 750 mg via ORAL
  Filled 2023-05-04: qty 1

## 2023-05-04 MED ORDER — LEVOFLOXACIN 750 MG PO TABS: 750.0000 mg | ORAL_TABLET | ORAL | Status: AC

## 2023-05-04 MED ORDER — BISACODYL 10 MG RE SUPP
10.0000 mg | Freq: Once | RECTAL | Status: AC
  Administered 2023-05-04: 10 mg via RECTAL
  Filled 2023-05-04: qty 1

## 2023-05-04 MED ORDER — INSULIN GLARGINE-YFGN 100 UNIT/ML ~~LOC~~ SOLN: 10.0000 [IU] | Freq: Every day | SUBCUTANEOUS | Status: AC

## 2023-05-04 NOTE — TOC Transition Note (Signed)
Transition of Care [facility]) - Discharge Note   Patient Details  Name: Nathaniel Henry MRN: [id number] Date of Birth: July 28, 1932  Transition [facility] CM/SW Contact:  Baldemar Lenis, LCSW Phone Number: 05/04/2023, 3:59 PM   Clinical Narrative:   Patient from [facility] and stable for return today. CSW updated [facility] and sent discharge information, confirmed ready to receive patient. CSW updated son, Greig Castilla, via phone, and he is in agreement. Transport arranged with [facility] for next available.  Nurse to call report to [phone], [facility] Room 39B.    Final next level of care: Skilled Nursing Facility Barriers to Discharge: Barriers Resolved   Patient Goals and CMS Choice Patient states their goals for this hospitalization and ongoing recovery are:: patient unable to participate in goal setting, not oriented CMS Medicare.gov Compare Post Acute Care list provided to:: Patient Represenative (must comment) Choice offered to / list presented to : Adult Children [place] ownership interest in [facility].provided to:: Adult Children    Discharge Placement              Patient chooses bed at: [facility] Patient to be transferred to facility by: [facility] Name of family member notified: Greig Castilla Patient and family notified of of transfer: 05/04/23  Discharge Plan and Services Additional resources added to the After Visit Summary for                                       Social Drivers of Health (SDOH) Interventions SDOH Screenings   Food Insecurity: Patient Unable To Answer (04/30/2023)  Housing: Patient Unable To Answer (04/30/2023)  Transportation Needs: Patient Unable To Answer (04/30/2023)  Utilities: Patient Unable To Answer (04/30/2023)  Social Connections: Socially Isolated (04/04/2023)  Tobacco Use: Low Risk  (03/28/2023)     Readmission Risk Interventions     No data to display

## 2023-05-04 NOTE — Discharge Summary (Signed)
Name: Nathaniel Henry MRN: [id number] DOB: 21-Mar-1933 89 y.o. PCP: Jamal Collin, PA-C  Date of Admission: 04/30/2023  9:52 AM Date of Discharge: 05/04/2023 Attending Physician: Dr. Antony Contras  Discharge Diagnosis: Principal Problem:   Acute encephalopathy Active Problems:   Diabetes mellitus, type 2 (HCC)   AKI (acute kidney injury) (HCC)   UTI (urinary tract infection)   Dementia (HCC)   Hyperglycemia   Hypernatremia   CKD (chronic kidney disease) stage 3, GFR 30-59 ml/min (HCC)   BPH (benign prostatic hyperplasia)   Advanced age    Discharge Medications: Allergies as of 05/04/2023   No Known Allergies      Medication List     PAUSE taking these medications    amLODipine 5 MG tablet Wait to take this until your doctor or other care provider tells you to start again. Commonly known as: NORVASC Take 5 mg by mouth every 12 (twelve) hours as needed (SBP>150, DBP>90).   benzonatate 200 MG capsule Wait to take this until your doctor or other care provider tells you to start again. Commonly known as: TESSALON Take 200 mg by mouth in the morning, at noon, and at bedtime.   nystatin 100000 UNIT/ML suspension Wait to take this until your doctor or other care provider tells you to start again. Commonly known as: MYCOSTATIN Use as directed 5 mLs in the mouth or throat 4 (four) times daily.       STOP taking these medications    cefadroxil 500 MG capsule Commonly known as: DURICEF   insulin aspart 100 UNIT/ML injection Commonly known as: novoLOG       TAKE these medications    acetaminophen 325 MG tablet Commonly known as: TYLENOL Take 325 mg by mouth in the morning and at bedtime.   acetaminophen 325 MG tablet Commonly known as: TYLENOL Take 2 tablets (650 mg total) by mouth every 6 (six) hours as needed for mild pain (pain score 1-3), moderate pain (pain score 4-6), fever or headache.   atorvastatin 10 MG tablet Commonly known as: LIPITOR Take 10 mg  by mouth every evening.   BOOST VHC PO Take 120 mLs by mouth in the morning, at noon, and at bedtime.   (feeding supplement) PROSource Plus liquid Take 30 mLs by mouth 2 (two) times daily between meals.   insulin glargine-yfgn 100 UNIT/ML injection Commonly known as: SEMGLEE Inject 0.1 mLs (10 Units total) into the skin daily. Start taking on: May 05, 2023   ipratropium-albuterol 0.5-2.5 (3) MG/3ML Soln Commonly known as: DUONEB Take 3 mLs by nebulization every 4 (four) hours as needed (SOB/Wheezing).   levofloxacin 750 MG tablet Commonly known as: LEVAQUIN Take 1 tablet (750 mg total) by mouth every other day for 2 doses. Next dose is 05/06/23, and final dose 05/08/23. Start taking on: May 06, 2023   levothyroxine 75 MCG tablet Commonly known as: SYNTHROID Take 75 mcg by mouth daily before breakfast.   magnesium hydroxide 400 MG/5ML suspension Commonly known as: MILK OF MAGNESIA Take 30 mLs by mouth daily as needed for mild constipation or moderate constipation.   melatonin 3 MG Tabs tablet Take 1 tablet (3 mg total) by mouth at bedtime as needed. What changed: reasons to take this   multivitamin with minerals Tabs tablet Take 1 tablet by mouth every other day. Alternates between a multivitamin and vitamin d3 supplement   pantoprazole 40 MG tablet Commonly known as: PROTONIX Take 2 tablets (80 mg total) by mouth daily. What changed:  how much to take when to take this   Vitamin D3 50 MCG (2000 UT) Tabs Take 2,000 Units by mouth every other day. Alternates between a multivitamin and vitamin d3 supplement        Disposition and follow-up:   Nathaniel Henry was discharged from [facility] in [place] condition.  At the hospital follow up visit please address:  a.  Type 2 diabetes He got 10 units of Semglee while hospitalized, blood glucose have been stable. -Please reassess if patient needs to be on metformin. - Continue 10u of semglee.     b.  Complicated UTI Urine culture grew Pseudomonas aeruginosa, has received 2 doses of IV cefepime. Will need to complete renally dose Levaquin 750 mg q48 hrs.  -Please assess for urinary retention, and completion of antibiotic.  c.  Hypernatremia Resolved, secondary to decreased p.o. intake.  d.  AKI on CKD 3A SCr  1.34, baseline 1.2-1.40. Please encourage p.o. intake.  2.  Labs / imaging needed at time of follow-up: Cbc, bmp.   3.  Pending labs/ test needing follow-up: None   4.  Medication Changes Abx -   End Date:  Follow-up Appointments:  Contact information for after-discharge care     Destination     [facility] .   Service: Skilled Nursing Contact information: [address] [place] [address] [phone]                     Hospital Course by problem list:  # Acute encephalopathy # Sepsis # Hyperglycemia  # UTI # hypernatremia On presentation, he was somnolent, and non responsive.  Work up in the ED showed CBG of 426, labs with hypernatremia of 153, normal beta hydroxy butyrate.  He was afebrile, leukocytosis to 20K and mild lactic elevation 3.2--> 1.8. On further chart review, patient's metformin and glipizide were held on his last admission.   Patient acute encephalopathy thought to be multifactorial, in the setting of sepsis, hyperglycemia, hyponatremia and UTI. Hyperglycemia secondary to being off his antidiabetes meds.  Hypernatremia secondary to decreased p.o. intake.   UA was concerning for UTI, and urine culture grew Pseudomonas aeruginosa. He received treatment of his hypernatremia with D5 W IVF's.  For his hyperglycemia, was treated with 10 units of long-acting insulin and SSI.  For UTI, urine culture grew Pseudomonas aeruginosa, was treated with IV cefepime, will need to complete 2 more doses of Levaquin 750 mg for 2 more doses  at SNF.    # Type 2 Diabetes Blood glucose elevated initially, improved with 10 units of  Semglee, and SSI.  Home regimen includes metformin and glipizide which we are holding on discharge.   - Please continue 10 units of Semglee after discharge. - Please follow-up with PCP about restarting metformin.   Complicated UTI UA consistent with UTI, urine culture grew Pseudomonas aeruginosa.  Will treat complicated UTI with 7 days of antibiotics, will need 3 more doses to complete a course.  -Continue Levaquin 750 mg every 48 hours for 2 more doses   # Acute Kidney injury Serum creatinine elevated on presentation, due to decreased p.o. intake, improved with IV fluids.  On day of discharge, SCr is 1.34.  # Concern for aspiration pneumonia Patient was evaluated by SLP, noted to be at high risk for aspiration.  Modified barium swallow negative for aspiration, with on going penetration with liquids.  Swallow Evaluation Recommendations Recommendations: PO diet PO Diet Recommendation: Dysphagia 1 (Pureed);Thin  liquids (Level 0) Liquid Administration via: Cup;Straw Medication Administration: Crushed with puree Supervision: Staff to assist with self-feeding;Full supervision/cueing for swallowing strategies Swallowing strategies  : Minimize environmental distractions;Slow rate;Small bites/sips;Hard cough after swallowing (one sip at a time; cough intermittently) Postural changes: Position pt fully upright for meals;Stay upright 30-60 min after meals Oral care recommendations: Oral care BID (2x/day)  # Hypothyroidism TSH normal. Continued with synthroid 75 mcg.         Discharge Subjective: Patient evaluated at bedside this AM.  Discharge Exam:   BP (!) 148/86 (BP Location: Right Arm)   Pulse 93   Temp 98.9 F (37.2 C) (Oral)   Resp 20   Ht 5\' 6"  (1.676 m)   Wt 78 kg   SpO2 99%   BMI 27.75 kg/m   Constitutional: NAD HENT:+ mucous membranes moist Cardiovascular: regular rate and rhythm, no m/r/g Pulmonary/Chest: normal work of breathing on room air, lungs clear to  auscultation bilaterally Skin: warm and dry with stage III sacral ulcer, w/o erythema or drainage.  Psych: alert and oriented to self, but not place or time.    Pertinent Labs, Studies, and Procedures:     Latest Ref Rng & Units 05/03/2023    7:00 AM 05/02/2023    6:33 AM 05/01/2023    6:52 AM  CBC  WBC 4.0 - 10.5 K/uL 9.1  12.3  14.0   Hemoglobin 13.0 - 17.0 g/dL 16.1  09.6  04.5   Hematocrit 39.0 - 52.0 % 37.3  36.9  40.5   Platelets 150 - 400 K/uL 288  285  281        Latest Ref Rng & Units 05/03/2023    7:00 AM 05/02/2023    6:33 AM 05/01/2023   10:42 PM  CMP  Glucose 70 - 99 mg/dL 409  811  914   BUN 8 - 23 mg/dL 35  38  43   Creatinine 0.61 - 1.24 mg/dL 7.82  9.56  2.13   Sodium 135 - 145 mmol/L 143  146  148   Potassium 3.5 - 5.1 mmol/L 3.8  3.7  3.8   Chloride 98 - 111 mmol/L 108  108  113   CO2 22 - 32 mmol/L 24  25  24    Calcium 8.9 - 10.3 mg/dL 9.8  08.6  57.8     CT Head Wo Contrast Result Date: 04/30/2023 CLINICAL DATA:  Altered mental status. EXAM: CT HEAD WITHOUT CONTRAST TECHNIQUE: Contiguous axial images were obtained from the base of the skull through the vertex without intravenous contrast. RADIATION DOSE REDUCTION: This exam was performed according to the departmental dose-optimization program which includes automated exposure control, adjustment of the mA and/or kV according to patient size and/or use of iterative reconstruction technique. COMPARISON:  Head CT dated 01/17/2014. FINDINGS: Brain: Moderate age-related atrophy and chronic microvascular ischemic changes. There is no acute intracranial hemorrhage. No mass effect or midline shift. No extra-axial fluid collection. Vascular: No hyperdense vessel or unexpected calcification. Skull: Normal. Negative for fracture or focal lesion. Sinuses/Orbits: No acute finding. Other: None IMPRESSION: 1. No acute intracranial pathology. 2. Moderate age-related atrophy and chronic microvascular ischemic changes. Electronically  Signed   By: Elgie Collard M.D.   On: 04/30/2023 12:56   DG Chest Port 1 View Result Date: 04/30/2023 CLINICAL DATA:  Questionable sepsis - evaluate for abnormality EXAM: PORTABLE CHEST 1 VIEW COMPARISON:  04/03/2023 FINDINGS: Large hiatal hernia. Minimal density at the right lung base. Left lung clear. No effusions.  Heart and mediastinal contours within normal limits. Prior CABG. IMPRESSION: Minimal right basilar atelectasis or early infiltrate. Large hiatal hernia. Electronically Signed   By: Charlett Nose M.D.   On: 04/30/2023 10:56     Discharge Instructions:   Signed: Laretta Bolster, MD 05/04/2023, 3:45 PM   Pager: [phone]

## 2023-05-04 NOTE — Progress Notes (Addendum)
Speech Language Pathology Treatment: Dysphagia  Patient Details Name: Nathaniel Henry MRN: [id number] DOB: 07/13/1932 Today's Date: 05/04/2023 Time: 4098-1191 SLP Time Calculation (min) (ACUTE ONLY): 15 min  Assessment / Plan / Recommendation Clinical Impression  Patient seen for dysphagia treatment with focus on po tolerance and need for further intervention. Patient repositioned to maximize safety with po intake. Po trials provided under skilled observation. He continues to have subtle indication of dysphagia characterized by multiple swallows and frequent belching as well as some indication of decreased airway protection characterized by throat clear post swallow although both are inconsistent. Po intake overall minimal today, limited to one bite of pureed solids and possibly 4 ounces of liquid before patient declined further. In light of possible early right sided infiltrate seen on most recent CXR, combined with the above signs, patient would benefit from an instrumental exam of his swallowing to evaluate swallowing physiology and ensure least restrictive diet and appropriate compensatory strategies/aspiration precautions are in place. Note possible discharge today. It would be reasonable to have patient be monitored at SNF by SLP and f/u for potential MBS as OP if needed as his pulmonary status appears stable based on MD notes. Discussed with MD who is in agreement to hold D/c today. Will plan for MBS in am tomorrow with possible d/c after.    HPI HPI: Nathaniel Henry is an 89 year old male with a history of T2DM, recurrent urinary tract infections (UTIs), and dementia, who presented to ED with AMS.  Pt with limited PO intake at nursing facility this past week. Head CT negative for acute abnormalities.  CXR remarkable for minimal right basilar atelectasis or early infiltrate and large hiatal hernia.      SLP Plan  Continue with current plan of care (possible MBS)     Recommendations for  follow up therapy are one component of a multi-disciplinary discharge planning process, led by the attending physician.  Recommendations may be updated based on patient status, additional functional criteria and insurance authorization.    Recommendations  Diet recommendations: Dysphagia 1 (puree);Thin liquid Liquids provided via: Cup;Straw Medication Administration: Crushed with puree Supervision: Staff to assist with self feeding;Full supervision/cueing for compensatory strategies Compensations: Minimize environmental distractions;Slow rate;Small sips/bites Postural Changes and/or Swallow Maneuvers: Seated upright 90 degrees;Upright 30-60 min after meal                 Oral care BID;Staff/trained caregiver to provide oral care   Frequent or constant Supervision/Assistance Dysphagia, oral phase (R13.11);Dysphagia, pharyngoesophageal phase (R13.14)     Continue with current plan of care (possible MBS)   Nathaniel Elkhatib MA, CCC-SLP   Nathaniel Henry  05/04/2023, 12:03 PM

## 2023-05-04 NOTE — Discharge Instructions (Addendum)
FOLLOW-UP INSTRUCTIONS:  Thank you for allowing [place] to be part of your care.  You were hospitalized due to elevated blood glucose, elevated sodium and confusion.  You were treated with IV fluids and insulin.  Your blood glucose and sodium are all stable, and your confusion has resolved.  Please follow up with the following providers: [facility], [address] / [place] [address], [phone]   Please note these changes made to your medications:   A. Medications to continue: Current Meds  Medication Sig   acetaminophen (TYLENOL) 325 MG tablet Take 2 tablets (650 mg total) by mouth every 6 (six) hours as needed for mild pain (pain score 1-3), moderate pain (pain score 4-6), fever or headache.   acetaminophen (TYLENOL) 325 MG tablet Take 325 mg by mouth in the morning and at bedtime.   amLODipine (NORVASC) 5 MG tablet Take 5 mg by mouth every 12 (twelve) hours as needed (SBP>150, DBP>90).   atorvastatin (LIPITOR) 10 MG tablet Take 10 mg by mouth every evening.   benzonatate (TESSALON) 200 MG capsule Take 200 mg by mouth in the morning, at noon, and at bedtime.   Cholecalciferol (VITAMIN D3) 2000 units TABS Take 2,000 Units by mouth every other day. Alternates between a multivitamin and vitamin d3 supplement   insulin aspart (NOVOLOG) 100 UNIT/ML injection Inject 15 Units into the skin once.   ipratropium-albuterol (DUONEB) 0.5-2.5 (3) MG/3ML SOLN Take 3 mLs by nebulization every 4 (four) hours as needed (SOB/Wheezing).   levothyroxine (SYNTHROID, LEVOTHROID) 75 MCG tablet Take 75 mcg by mouth daily before breakfast.   magnesium hydroxide (MILK OF MAGNESIA) 400 MG/5ML suspension Take 30 mLs by mouth daily as needed for mild constipation or moderate constipation.   melatonin 3 MG TABS tablet Take 1 tablet (3 mg total) by mouth at bedtime as needed. (Patient taking differently: Take 3 mg by mouth at bedtime as needed (insomnia).)   Multiple Vitamin (MULTIVITAMIN  WITH MINERALS) TABS tablet Take 1 tablet by mouth every other day. Alternates between a multivitamin and vitamin d3 supplement   Nutritional Supplements (,FEEDING SUPPLEMENT, PROSOURCE PLUS) liquid Take 30 mLs by mouth 2 (two) times daily between meals.   Nutritional Supplements (BOOST VHC PO) Take 120 mLs by mouth in the morning, at noon, and at bedtime.   nystatin (MYCOSTATIN) 100000 UNIT/ML suspension Use as directed 5 mLs in the mouth or throat 4 (four) times daily.   pantoprazole (PROTONIX) 40 MG tablet Take 2 tablets (80 mg total) by mouth daily. (Patient taking differently: Take 40 mg by mouth 2 (two) times daily.)      B. Medications to start:        - Semglee 10 units daily.        - Levaquin 750 mg Q48 hrs for 2 more doses. C. Medications to discontinue: None.   Please make sure to return to the hospital if you have worsening confusion, elevated blood glucose or any new fevers.  Please call our clinic if you have any questions or concerns, we may be able to help and keep you from a long and expensive emergency room wait. Our clinic and after hours phone number is [phone], the best time to call is Monday through Friday 9 am to 4 pm but there is always someone available 24/7 if you have an emergency. If you need medication refills please notify your pharmacy one week in advance and they will send [place] a request.

## 2023-05-04 NOTE — NC FL2 (Signed)
[place] MEDICAID FL2 LEVEL OF CARE FORM     IDENTIFICATION  Patient Name: Nathaniel Henry Birthdate: [birth year] Sex: male Admission Date (Current Location): 04/30/2023  [facility] and [place] Number:  [employer] and Address:  [address] [facility], [address], [place], [place] [address]      Provider Number: [id number]  Attending Physician Name and Address:  Reymundo Poll, MD  Relative Name and Phone Number:       Current Level of Care: Hospital Recommended Level of Care: Skilled Nursing Facility Prior Approval Number:    Date Approved/Denied:   PASRR Number:    Discharge Plan: SNF    Current Diagnoses: Patient Active Problem List   Diagnosis Date Noted   Dementia (HCC) 05/01/2023   Hyperglycemia 05/01/2023   Hypernatremia 05/01/2023   CKD (chronic kidney disease) stage 3, GFR 30-59 ml/min (HCC) 05/01/2023   BPH (benign prostatic hyperplasia) 05/01/2023   Advanced age 88/31/2025   Acute encephalopathy 04/30/2023   UTI (urinary tract infection) 04/05/2023   Failure to thrive in adult 04/04/2023   Hypotension due to drugs 04/04/2023   Diabetes mellitus, type 2 (HCC) 04/03/2023   Iron deficiency anemia 04/03/2023   Decubitus ulcer limited to breakdown of skin (stage 2) (HCC) 04/03/2023   AKI (acute kidney injury) (HCC) 04/03/2023   Hemoglobinuria 04/03/2023   Retention of urine, unspecified 09/16/2021   OA (osteoarthritis) of hip 11/04/2017    Orientation RESPIRATION BLADDER Height & Weight     Self  Normal Incontinent Weight: 171 lb 15.3 oz (78 kg) Height:  5\' 6"  (167.6 cm)  BEHAVIORAL SYMPTOMS/MOOD NEUROLOGICAL BOWEL NUTRITION STATUS      Incontinent Diet (see DC summary)  AMBULATORY STATUS COMMUNICATION OF NEEDS Skin   Extensive Assist Verbally Normal                       Personal Care Assistance Level of Assistance  Bathing, Feeding, Dressing Bathing Assistance: Maximum assistance Feeding assistance: Maximum  assistance Dressing Assistance: Maximum assistance     Functional Limitations Info  Hearing, Speech   Hearing Info: Impaired Speech Info: Impaired    SPECIAL CARE FACTORS FREQUENCY                       Contractures Contractures Info: Not present    Additional Factors Info  Code Status, Allergies, Insulin Sliding Scale Code Status Info: DNR Allergies Info: NKA   Insulin Sliding Scale Info: see DC summary       Current Medications (05/04/2023):  This is the current hospital active medication list Current Facility-Administered Medications  Medication Dose Route Frequency Provider Last Rate Last Admin   atorvastatin (LIPITOR) tablet 10 mg  10 mg Oral QPM Rana Snare, DO   10 mg at 05/03/23 1711   enoxaparin (LOVENOX) injection 40 mg  40 mg Subcutaneous Q24H Rana Snare, DO   40 mg at 05/03/23 1431   feeding supplement (ENSURE ENLIVE / ENSURE PLUS) liquid 237 mL  237 mL Oral BID BM Ginnie Smart, MD   237 mL at 05/04/23 0825   insulin aspart (novoLOG) injection 0-15 Units  0-15 Units Subcutaneous TID WC Reymundo Poll, MD   3 Units at 05/04/23 0719   insulin aspart (novoLOG) injection 0-5 Units  0-5 Units Subcutaneous QHS Reymundo Poll, MD   2 Units at 05/02/23 2128   insulin glargine-yfgn (SEMGLEE) injection 10 Units  10 Units Subcutaneous Daily Laretta Bolster, MD  10 Units at 05/04/23 0824   levofloxacin (LEVAQUIN) tablet 750 mg  750 mg Oral Q48H Koomson, Lisbeth Ply, MD       levothyroxine (SYNTHROID) tablet 75 mcg  75 mcg Oral QAC breakfast Rana Snare, DO   75 mcg at 05/04/23 0610   pantoprazole (PROTONIX) EC tablet 80 mg  80 mg Oral Daily Rana Snare, DO   80 mg at 05/04/23 0820   senna-docusate (Senokot-S) tablet 1 tablet  1 tablet Oral QHS PRN Morene Crocker, MD         Discharge Medications: Please see discharge summary for a list of discharge medications.  Relevant Imaging Results:  Relevant Lab Results:   Additional  Information SS#: [id number]  Baldemar Lenis, LCSW

## 2023-05-04 NOTE — Evaluation (Signed)
Modified Barium Swallow Study  Patient Details  Name: Nathaniel Henry MRN: [id number] Date of Birth: 1932/04/27  Today's Date: 05/04/2023  Modified Barium Swallow completed.  Full report located under Chart Review in the Imaging Section.  History of Present Illness Kais A. Ragsdale is an 89 year old male with a history of T2DM, recurrent urinary tract infections (UTIs), and dementia, who presented to ED with AMS.  Pt with limited PO intake at nursing facility this past week. Head CT negative for acute abnormalities.  CXR remarkable for minimal right basilar atelectasis or early infiltrate and large hiatal hernia.   Clinical Impression Pt has an oropharyngeal dysphagia motor and timing deficits noted. He also had a significant amount of anterior and R-sided leaning, with his pharynx becoming more horizontal as study progressed. Pt had minimal mastication without access to his dentures, and had no posterior transit of solid foods without assistance from liquid wash. His posterior transit is more functional with liquids and purees, but a barium tablet also had to be expectorated from his mouth. Pt had no aspiration across the study but did have penetration with thin liquids that started to occur with consecutive boluses as his posisioning declined. Penetration was not always cleared (PAS 3) but never progressed to his vocal folds or beyond. A cued cough helped to at least move penetrates further away from his vocal folds. Recommend continuing with Dys 1 (puree) diet and thin liquids with careful assistance during meals for positioning and cueing to take one sip at a time.  Factors that may increase risk of adverse event in presence of aspiration [employer] 2021): Respiratory or GI disease;Reduced cognitive function;Weak cough  Swallow Evaluation Recommendations Recommendations: PO diet PO Diet Recommendation: Dysphagia 1 (Pureed);Thin liquids (Level 0) Liquid Administration via:  Cup;Straw Medication Administration: Crushed with puree Supervision: Staff to assist with self-feeding;Full supervision/cueing for swallowing strategies Swallowing strategies  : Minimize environmental distractions;Slow rate;Small bites/sips;Hard cough after swallowing (one sip at a time; cough intermittently) Postural changes: Position pt fully upright for meals;Stay upright 30-60 min after meals Oral care recommendations: Oral care BID (2x/day)      Mahala Menghini., M.A. CCC-SLP Acute Rehabilitation Services Office [phone]  Secure chat preferred  05/04/2023,3:24 PM

## 2023-05-04 NOTE — Plan of Care (Signed)
Patient medically ready for SNF.   Problem: Nutrition: Goal: Adequate nutrition will be maintained Outcome: Not Met (add Reason)   Problem: Pain Managment: Goal: General experience of comfort will improve and/or be controlled Outcome: Not Met (add Reason)   Problem: Safety: Goal: Ability to remain free from injury will improve Outcome: Not Met (add Reason)   Problem: Skin Integrity: Goal: Risk for impaired skin integrity will decrease Outcome: Not Met (add Reason)   Problem: Tissue Perfusion: Goal: Adequacy of tissue perfusion will improve Outcome: Not Met (add Reason)

## 2023-05-05 LAB — CULTURE, BLOOD (ROUTINE X 2)
Culture: NO GROWTH
Culture: NO GROWTH

## 2023-05-30 DEATH — deceased
